# Patient Record
Sex: Male | Born: 1995 | Race: White | Hispanic: No | Marital: Single | Smoking: Current some day smoker
Health system: Southern US, Community
[De-identification: ages and names within clinical notes are randomized; demographics above are authoritative.]

## PROBLEM LIST (undated history)

## (undated) DIAGNOSIS — Z21 Asymptomatic human immunodeficiency virus [HIV] infection status: Secondary | ICD-10-CM

## (undated) DIAGNOSIS — B20 Human immunodeficiency virus [HIV] disease: Secondary | ICD-10-CM

## (undated) HISTORY — DX: Asymptomatic human immunodeficiency virus (hiv) infection status: Z21

## (undated) HISTORY — DX: Human immunodeficiency virus (HIV) disease: B20

---

## 2014-01-15 ENCOUNTER — Encounter (HOSPITAL_COMMUNITY): Payer: Self-pay | Admitting: Emergency Medicine

## 2014-01-15 ENCOUNTER — Emergency Department (HOSPITAL_COMMUNITY)
Admission: EM | Admit: 2014-01-15 | Discharge: 2014-01-15 | Disposition: A | Discharge status: Home / Self Care | Payer: Managed Care, Other (non HMO) | Attending: Emergency Medicine | Admitting: Emergency Medicine

## 2014-01-15 DIAGNOSIS — F17200 Nicotine dependence, unspecified, uncomplicated: Secondary | ICD-10-CM | POA: Diagnosis not present

## 2014-01-15 DIAGNOSIS — R197 Diarrhea, unspecified: Secondary | ICD-10-CM

## 2014-01-15 LAB — CBC WITH DIFFERENTIAL/PLATELET
Basophils Absolute: 0.1 10*3/uL (ref 0.0–0.1)
Basophils Relative: 1 % (ref 0–1)
EOS ABS: 0.2 10*3/uL (ref 0.0–0.7)
Eosinophils Relative: 2 % (ref 0–5)
HCT: 48.9 % (ref 39.0–52.0)
HEMOGLOBIN: 16.4 g/dL (ref 13.0–17.0)
LYMPHS ABS: 3 10*3/uL (ref 0.7–4.0)
Lymphocytes Relative: 24 % (ref 12–46)
MCH: 28.5 pg (ref 26.0–34.0)
MCHC: 33.5 g/dL (ref 30.0–36.0)
MCV: 84.9 fL (ref 78.0–100.0)
Monocytes Absolute: 1 10*3/uL (ref 0.1–1.0)
Monocytes Relative: 8 % (ref 3–12)
NEUTROS ABS: 7.9 10*3/uL — AB (ref 1.7–7.7)
NEUTROS PCT: 65 % (ref 43–77)
Platelets: 314 10*3/uL (ref 150–400)
RBC: 5.76 MIL/uL (ref 4.22–5.81)
RDW: 13.6 % (ref 11.5–15.5)
WBC: 12.2 10*3/uL — AB (ref 4.0–10.5)

## 2014-01-15 LAB — COMPREHENSIVE METABOLIC PANEL
ALK PHOS: 94 U/L (ref 39–117)
ALT: 15 U/L (ref 0–53)
ANION GAP: 12 (ref 5–15)
AST: 20 U/L (ref 0–37)
Albumin: 4.5 g/dL (ref 3.5–5.2)
BILIRUBIN TOTAL: 0.9 mg/dL (ref 0.3–1.2)
BUN: 17 mg/dL (ref 6–23)
CHLORIDE: 110 meq/L (ref 96–112)
CO2: 21 mEq/L (ref 19–32)
Calcium: 9.5 mg/dL (ref 8.4–10.5)
Creatinine, Ser: 1.28 mg/dL (ref 0.50–1.35)
GFR calc non Af Amer: 80 mL/min — ABNORMAL LOW (ref >90)
GLUCOSE: 87 mg/dL (ref 70–99)
POTASSIUM: 3.9 meq/L (ref 3.7–5.3)
Sodium: 143 mEq/L (ref 137–147)
Total Protein: 7.7 g/dL (ref 6.0–8.3)

## 2014-01-15 LAB — LIPASE, BLOOD: Lipase: 19 U/L (ref 11–59)

## 2014-01-15 MED ORDER — DIPHENOXYLATE-ATROPINE 2.5-0.025 MG PO TABS: 2.0000 | ORAL_TABLET | Freq: Four times a day (QID) | ORAL | Status: DC | PRN

## 2014-01-15 NOTE — ED Notes (Signed)
Patient reports abdominal cramping onset yesterday with "black" diarrhea, reports 4 episodes of same diarrhea today, denies vomiting, pain 1/10, non-radiating, denies fever/chills, reports dizziness earlier today, denies at this time, reports decreased urine volume. Patient reports urgency to urinate but hesitation for same.

## 2014-01-15 NOTE — Progress Notes (Signed)
  CARE MANAGEMENT ED NOTE 01/15/2014  Patient:  Ronnie Patterson, Ronnie Patterson   Account Number:  [id number]  Date Initiated:  01/15/2014  Documentation initiated by:  Edd Arbour  Subjective/Objective Assessment:   18 yr old [employer] managed diarrhea x2 days. Pt. States he had diarrhea all day yesterday, this AM his stool was loose and black in color. Pt. Denies N/V.     Subjective/Objective Assessment Detail:   Pt confirms he is in "school here" (in [place]) but from [place]  Pt informed CM " the doctor states I will be fine" (referring to EDP, Freida Busman) when Cm asked him if he had a pc to f/u with     Action/Plan:   ED CM spoke with pt and male at bedside Discussed importance of f/u with a family doctor even in [place] via [place] coverage see note below   Action/Plan Detail:   Anticipated DC Date:  01/15/2014     Status Recommendation to Physician:   Result of Recommendation:    Other ED Services  Consult Working Plan    DC Planning Services  Other  PCP issues  Outpatient Services - Pt will follow up    Choice offered to / List presented to:            Status of service:  Completed, signed off  ED Comments:   ED Comments Detail:  [facility] ED CM spoke with pt on how to obtain an in network pcp with insurance coverage via the customer service number or web site Cm reviewed ED level of care for crisis/emergent services and community pcp level of care to manage continuous or chronic medical concerns.  The pt voiced understanding CM encouraged pt and discussed pt's responsibility to verify with pt's insurance carrier that any recommended medical provider offered by any emergency room or a hospital provider is within the carrier's network. The pt voiced understanding

## 2014-01-15 NOTE — ED Notes (Signed)
Pt is aware that we need a urine sample 

## 2014-01-15 NOTE — ED Notes (Signed)
Pt reports to ED with diarrhea x2 days. Pt. States he had diarrhea all day yesterday, this AM his stool was loose and black in color. Pt. Denies N/V.

## 2014-01-15 NOTE — Discharge Instructions (Signed)

## 2014-01-15 NOTE — ED Notes (Signed)
Key pad not working. Patient verbalizes understanding of medication and d/c instructions.

## 2014-01-15 NOTE — ED Notes (Signed)
Patient tried to urinate, has urinal in room

## 2014-01-15 NOTE — ED Provider Notes (Signed)
CSN: [id number]     Arrival date & time 01/15/14  1200 History   First MD Initiated Contact with Patient 01/15/14 1216     Chief Complaint  Patient presents with  . Diarrhea     (Consider location/radiation/quality/duration/timing/severity/associated sxs/prior Treatment) HPI Comments: Patient here with diarrhea x2 days. Symptoms originally started 5 days ago subsided spontaneously became again today to go. No fever or chills. Diarrhea characterized as watery. No nausea vomiting appreciated. Stool is slightly black in color but he denies any use of Pepto-Bismol. Denies any urinary symptoms. No prior history of same.  Patient is a 18 y.o. male presenting with diarrhea. The history is provided by the patient.  Diarrhea   History reviewed. No pertinent past medical history. History reviewed. No pertinent past surgical history. No family history on file. History  Substance Use Topics  . Smoking status: Current Every Day Smoker -- 0.50 packs/day    Types: Cigarettes  . Smokeless tobacco: Not on file  . Alcohol Use: Yes     Comment: occassionally     Review of Systems  Gastrointestinal: Positive for diarrhea.  All other systems reviewed and are negative.     Allergies  Review of patient's allergies indicates not on file.  Home Medications   Prior to Admission medications   Not on File   BP 155/85  Pulse 96  Temp(Src) 97.7 F (36.5 C) (Oral)  Resp 16  Ht  (1.778 m)  Wt 146 lb 8 oz (66.452 kg)  BMI 21.02 kg/m2  SpO2 100% Physical Exam  Nursing note and vitals reviewed. Constitutional: He is oriented to person, place, and time. He appears well-developed and well-nourished.  Non-toxic appearance. No distress.  HENT:  Head: Normocephalic and atraumatic.  Eyes: Conjunctivae, EOM and lids are normal. Pupils are equal, round, and reactive to light.  Neck: Normal range of motion. Neck supple. No tracheal deviation present. No mass present.  Cardiovascular: Normal  rate, regular rhythm and normal heart sounds.  Exam reveals no gallop.   No murmur heard. Pulmonary/Chest: Effort normal and breath sounds normal. No stridor. No respiratory distress. He has no decreased breath sounds. He has no wheezes. He has no rhonchi. He has no rales.  Abdominal: Soft. Normal appearance and bowel sounds are normal. He exhibits no distension. There is no tenderness. There is no rigidity, no rebound, no guarding and no CVA tenderness.  Musculoskeletal: Normal range of motion. He exhibits no edema and no tenderness.  Neurological: He is alert and oriented to person, place, and time. He has normal strength. No cranial nerve deficit or sensory deficit. GCS eye subscore is 4. GCS verbal subscore is 5. GCS motor subscore is 6.  Skin: Skin is warm and dry. No abrasion and no rash noted.  Psychiatric: He has a normal mood and affect. His speech is normal and behavior is normal.    ED Course  Procedures (including critical care time) Labs Review Labs Reviewed  CBC WITH DIFFERENTIAL - Abnormal; Notable for the following:    WBC 12.2 (*)    Neutro Abs 7.9 (*)    All other components within normal limits  COMPREHENSIVE METABOLIC PANEL  LIPASE, BLOOD  URINALYSIS, ROUTINE W REFLEX MICROSCOPIC    Imaging Review No results found.   EKG Interpretation None      MDM   Final diagnoses:  None    Patient with likely gastroenteritis and stable for discharge    Toy Baker, MD 01/15/14 1449

## 2017-09-20 LAB — POCT RAPID HIV: Rapid HIV, POC: POSITIVE

## 2017-09-27 ENCOUNTER — Telehealth: Payer: Self-pay | Admitting: *Deleted

## 2017-09-27 DIAGNOSIS — Z113 Encounter for screening for infections with a predominantly sexual mode of transmission: Secondary | ICD-10-CM

## 2017-09-27 DIAGNOSIS — Z79899 Other long term (current) drug therapy: Secondary | ICD-10-CM

## 2017-09-27 DIAGNOSIS — B20 Human immunodeficiency virus [HIV] disease: Secondary | ICD-10-CM

## 2017-09-27 NOTE — Telephone Encounter (Signed)
Patient walked in to ask about his HIV referral, but had to leave to take a phone call. RN received referral from [facility] 5/28.  Appointments tentatively scheduled. Will confirm with [facility] tomorrow. Lab orders placed. [employer] form initiated. Andree Coss, RN

## 2017-09-28 ENCOUNTER — Telehealth: Payer: Self-pay | Admitting: *Deleted

## 2017-09-28 ENCOUNTER — Encounter: Payer: Managed Care, Other (non HMO) | Admitting: Licensed Clinical Social Worker

## 2017-09-28 ENCOUNTER — Other Ambulatory Visit (INDEPENDENT_AMBULATORY_CARE_PROVIDER_SITE_OTHER): Payer: Managed Care, Other (non HMO)

## 2017-09-28 ENCOUNTER — Ambulatory Visit: Payer: Managed Care, Other (non HMO)

## 2017-09-28 ENCOUNTER — Other Ambulatory Visit (HOSPITAL_COMMUNITY)
Admission: RE | Admit: 2017-09-28 | Discharge: 2017-09-28 | Disposition: A | Discharge status: Home / Self Care | Payer: Managed Care, Other (non HMO) | Source: Ambulatory Visit | Attending: Family | Admitting: Family

## 2017-09-28 DIAGNOSIS — B20 Human immunodeficiency virus [HIV] disease: Secondary | ICD-10-CM | POA: Diagnosis not present

## 2017-09-28 DIAGNOSIS — Z113 Encounter for screening for infections with a predominantly sexual mode of transmission: Secondary | ICD-10-CM | POA: Diagnosis present

## 2017-09-28 DIAGNOSIS — F4329 Adjustment disorder with other symptoms: Secondary | ICD-10-CM | POA: Diagnosis not present

## 2017-09-28 DIAGNOSIS — Z79899 Other long term (current) drug therapy: Secondary | ICD-10-CM

## 2017-09-28 NOTE — Telephone Encounter (Signed)
Spoke with Ivin Booty. He will be able to come today for bloodwork, to meet with RN. He confirmed insurance coverage - [employer].

## 2017-09-28 NOTE — BH Specialist Note (Signed)
Integrated Behavioral Health Initial Visit  MRN: [id number] Name: Ronnie Patterson  Number of Integrated Behavioral Health Clinician visits:: 1/6 Session Start time: 11:58pm  Session End time: 12:15pm Total time: 15 minutes  Type of Service: Integrated Behavioral Health- Individual/Family Interpretor:No. Interpretor Name and Language: N/a   Warm Hand Off Completed.       SUBJECTIVE: Ronnie Patterson is a 22 y.o. male accompanied by Self Patient was referred by Marcos Eke for adjustment to diagnosis. Patient reports the following symptoms/concerns: recently diagnosis, in shock, not sure what to do next, trouble concentrating, worried for the future Duration of problem: 1 week; Severity of problem: moderate  OBJECTIVE: Mood: Anxious and Affect: Blunt Risk of harm to self or others: No plan to harm self or others  LIFE CONTEXT: Patient has just learned of his HIV+ status and is not sure what he thinks or feels at present. He states that he is in shock and needs to let the news set in. Patient reports a history of depression and worries a little that this may return. He took Welbutrin for 6 months and is hoping to restart it soon.  GOALS ADDRESSED: Patient will: 1. Reduce symptoms of: anxiety  INTERVENTIONS: Interventions utilized: Motivational Interviewing and Brief CBT   ASSESSMENT: Patient currently experiencing worry and shock. He is struggling to adjust to the news that he is HIV+. 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 Counselor will continue to assess for diagnosis of depression, as reported, though today he does not endorse symptoms of depression (this may be masked by shock/adjustment concerns). Counselor educated patient on self-care and commended him for asking about restarting meds. Counselor and patient explored patient's goals and what he wants to see for himself. At the moment he wants to adjust to living with HIV and  will reassess once he feels he is better able to accept the diagnosis.    Patient may benefit from regular counseling to address adjustment concerns.   PLAN: 1. Follow up with behavioral health clinician on : 10/12/17 2. Behavioral recommendations: engage in self-care and utilize supports given to learn about diagnosis.  Angus Palms, LCSW

## 2017-09-28 NOTE — Telephone Encounter (Signed)
RN met with Ronnie Patterson at his intake lab visit. He states he is doing "ok" with his diagnosis, has had 1 week to process and feels ready to be in control. He has disclosed to several friends, managers at work whom he feel are supportive. He does have concerns about disclosing to his mother. RN offered support, stated he was welcome to bring anyone to his visits with him. RN advised we can offer partner testing, PrEP, and counseling services. He is familiar with [facility], he will meet at appointment 6/12. RN introduced him to [place] and [place]. Confirmed upcoming appointments. Landis Gandy, RN

## 2017-09-29 LAB — URINALYSIS
Bilirubin Urine: NEGATIVE
Glucose, UA: NEGATIVE
Hgb urine dipstick: NEGATIVE
Ketones, ur: NEGATIVE
Leukocytes, UA: NEGATIVE
Nitrite: NEGATIVE
PROTEIN: NEGATIVE
Specific Gravity, Urine: 1.021 (ref 1.001–1.03)

## 2017-09-29 LAB — T-HELPER CELL (CD4) - (RCID CLINIC ONLY)
CD4 T CELL ABS: 1180 /uL (ref 400–2700)
CD4 T CELL HELPER: 40 % (ref 33–55)

## 2017-09-29 LAB — QUANTIFERON-TB GOLD PLUS
NIL: 0.12 IU/mL
QuantiFERON-TB Gold Plus: NEGATIVE
TB1-NIL: 0.01 IU/mL
TB2-NIL: 0.1 IU/mL

## 2017-09-29 LAB — URINE CYTOLOGY ANCILLARY ONLY
Chlamydia: NEGATIVE
Neisseria Gonorrhea: NEGATIVE

## 2017-10-04 LAB — COMPLETE METABOLIC PANEL WITH GFR
AG Ratio: 1.8 (calc) (ref 1.0–2.5)
ALT: 19 U/L (ref 9–46)
AST: 22 U/L (ref 10–40)
Albumin: 4.6 g/dL (ref 3.6–5.1)
Alkaline phosphatase (APISO): 72 U/L (ref 40–115)
BUN: 16 mg/dL (ref 7–25)
CALCIUM: 9.5 mg/dL (ref 8.6–10.3)
CO2: 28 mmol/L (ref 20–32)
Chloride: 107 mmol/L (ref 98–110)
Creat: 1.2 mg/dL (ref 0.60–1.35)
GFR, EST NON AFRICAN AMERICAN: 85 mL/min/{1.73_m2} (ref >=60)
GFR, Est African American: 99 mL/min/{1.73_m2} (ref >=60)
Globulin: 2.5 g/dL (calc) (ref 1.9–3.7)
Glucose, Bld: 76 mg/dL (ref 65–99)
Potassium: 4.2 mmol/L (ref 3.5–5.3)
SODIUM: 143 mmol/L (ref 135–146)
Total Bilirubin: 0.9 mg/dL (ref 0.2–1.2)
Total Protein: 7.1 g/dL (ref 6.1–8.1)

## 2017-10-04 LAB — CBC WITH DIFFERENTIAL/PLATELET
BASOS ABS: 73 {cells}/uL (ref 0–200)
BASOS PCT: 1.2 %
EOS ABS: 342 {cells}/uL (ref 15–500)
Eosinophils Relative: 5.6 %
HCT: 45 % (ref 38.5–50.0)
Hemoglobin: 15.6 g/dL (ref 13.2–17.1)
Lymphs Abs: 2794 cells/uL (ref 850–3900)
MCH: 28.6 pg (ref 27.0–33.0)
MCHC: 34.7 g/dL (ref 32.0–36.0)
MCV: 82.4 fL (ref 80.0–100.0)
MONOS PCT: 12.5 %
MPV: 9.5 fL (ref 7.5–12.5)
NEUTROS ABS: 2129 {cells}/uL (ref 1500–7800)
Neutrophils Relative %: 34.9 %
Platelets: 279 10*3/uL (ref 140–400)
RBC: 5.46 10*6/uL (ref 4.20–5.80)
RDW: 13.4 % (ref 11.0–15.0)
Total Lymphocyte: 45.8 %
WBC mixed population: 763 cells/uL (ref 200–950)
WBC: 6.1 10*3/uL (ref 3.8–10.8)

## 2017-10-04 LAB — HEPATITIS B SURFACE ANTIBODY,QUALITATIVE: HEP B S AB: NONREACTIVE

## 2017-10-04 LAB — RPR: RPR Ser Ql: REACTIVE — AB

## 2017-10-04 LAB — LIPID PANEL
CHOLESTEROL: 126 mg/dL (ref <200)
HDL: 52 mg/dL (ref >40)
LDL Cholesterol (Calc): 58 mg/dL (calc)
Non-HDL Cholesterol (Calc): 74 mg/dL (calc) (ref <130)
Total CHOL/HDL Ratio: 2.4 (calc) (ref <5.0)
Triglycerides: 80 mg/dL (ref <150)

## 2017-10-04 LAB — HIV-1/2 AB - DIFFERENTIATION
HIV 1 ANTIBODY: POSITIVE — AB
HIV 2 AB: NEGATIVE

## 2017-10-04 LAB — FLUORESCENT TREPONEMAL AB(FTA)-IGG-BLD: Fluorescent Treponemal ABS: REACTIVE — AB

## 2017-10-04 LAB — HLA B*5701: HLA-B*5701 w/rflx HLA-B High: NEGATIVE

## 2017-10-04 LAB — HEPATITIS C ANTIBODY
HEP C AB: NONREACTIVE
SIGNAL TO CUT-OFF: 0.04 (ref <1.00)

## 2017-10-04 LAB — HEPATITIS B SURFACE ANTIGEN: Hepatitis B Surface Ag: NONREACTIVE

## 2017-10-04 LAB — HIV ANTIBODY (ROUTINE TESTING W REFLEX): HIV: REACTIVE — AB

## 2017-10-04 LAB — RPR TITER: RPR Titer: 1:32 {titer} — ABNORMAL HIGH

## 2017-10-04 LAB — HEPATITIS A ANTIBODY, TOTAL: Hepatitis A AB,Total: NONREACTIVE

## 2017-10-04 LAB — HEPATITIS B CORE ANTIBODY, TOTAL: HEP B C TOTAL AB: NONREACTIVE

## 2017-10-12 ENCOUNTER — Ambulatory Visit: Payer: Managed Care, Other (non HMO) | Admitting: Licensed Clinical Social Worker

## 2017-10-12 ENCOUNTER — Encounter: Payer: Self-pay | Admitting: Family

## 2017-10-12 ENCOUNTER — Ambulatory Visit (INDEPENDENT_AMBULATORY_CARE_PROVIDER_SITE_OTHER): Payer: Managed Care, Other (non HMO) | Admitting: Family

## 2017-10-12 ENCOUNTER — Ambulatory Visit (INDEPENDENT_AMBULATORY_CARE_PROVIDER_SITE_OTHER): Payer: Managed Care, Other (non HMO) | Admitting: Pharmacist

## 2017-10-12 VITALS — BP 109/69 | HR 73 | Temp 98.2°F | Ht 69.0 in | Wt 148.0 lb

## 2017-10-12 DIAGNOSIS — A530 Latent syphilis, unspecified as early or late: Secondary | ICD-10-CM

## 2017-10-12 DIAGNOSIS — R55 Syncope and collapse: Secondary | ICD-10-CM | POA: Diagnosis not present

## 2017-10-12 DIAGNOSIS — B20 Human immunodeficiency virus [HIV] disease: Secondary | ICD-10-CM

## 2017-10-12 LAB — HIV-1 RNA ULTRAQUANT REFLEX TO GENTYP+
HIV 1 RNA QUANT: 1330 {copies}/mL — AB
HIV-1 RNA Quant, Log: 3.12 Log cps/mL — ABNORMAL HIGH

## 2017-10-12 LAB — RFLX HIV-1 INTEGRASE GENOTYPE: HIV-1 Genotype: DETECTED — AB

## 2017-10-12 MED ORDER — PENICILLIN G BENZATHINE 1200000 UNIT/2ML IM SUSP
2.4000 10*6.[IU] | Freq: Once | INTRAMUSCULAR | Status: DC
  Administered 2017-10-12: 2.4 10*6.[IU] via INTRAMUSCULAR

## 2017-10-12 MED ORDER — BICTEGRAVIR-EMTRICITAB-TENOFOV 50-200-25 MG PO TABS: 1.0000 | ORAL_TABLET | Freq: Every day | ORAL | 2 refills | Status: DC

## 2017-10-12 MED ORDER — PENICILLIN G BENZATHINE 1200000 UNIT/2ML IM SUSP
2.4000 10*6.[IU] | INTRAMUSCULAR | Status: DC
  Administered 2017-10-12: 2.4 10*6.[IU] via INTRAMUSCULAR

## 2017-10-12 MED FILL — BIKTARVY 50-200-25 MG TABS: 50-200-25 | 30 days supply | Qty: 30 | Fill #0

## 2017-10-12 NOTE — Progress Notes (Signed)
HPI: Ronnie Patterson is a 22 y.o. male who presents to the [facility] clinic today to establish care for his newly diagnosed HIV infection.  Patient Active Problem List   Diagnosis Date Noted  . HIV disease (HCC) 10/12/2017  . 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 Vasovagal syncope 10/12/2017    Patient's Medications  New Prescriptions   BICTEGRAVIR-EMTRICITABINE-TENOFOVIR AF (BIKTARVY) 50-200-25 MG TABS TABLET    Take 1 tablet by mouth daily.  Previous Medications   BISMUTH SUBSALICYLATE (PEPTO BISMOL) 262 MG/15ML SUSPENSION    Take 30 mLs by mouth every 6 (six) hours as needed for indigestion.   IBUPROFEN (ADVIL,MOTRIN) 200 MG TABLET    Take 400 mg by mouth every 6 (six) hours as needed for moderate pain.  Modified Medications   No medications on file  Discontinued Medications   No medications on file    Allergies: No Known Allergies  Past Medical History: Past Medical History:  Diagnosis Date  . HIV infection [facility](HCC)     Social History: Social History   Socioeconomic History  . Marital status: Single    Spouse name: Not on file  . Number of children: Not on file  . Years of education: Not on file  . Highest education level: Not on file  Occupational History  . Not on file  Social Needs  . Financial resource strain: Not on file  . Food insecurity:    Worry: Not on file    Inability: Not on file  . Transportation needs:    Medical: Not on file    Non-medical: Not on file  Tobacco Use  . Smoking status: Current Every Day Smoker    Packs/day: 0.50    Years: 4.00    Pack years: 2.00    Types: Cigarettes, E-cigarettes  . Smokeless tobacco: Never Used  Substance and Sexual Activity  . Alcohol use: Yes    Comment: occassionally   . Drug use: Yes    Frequency: 7.0 times per week    Types: Marijuana  . Sexual activity: Not on file  Lifestyle  . Physical activity:    Days per week: Not on file    Minutes per session: Not on file  . Stress: Not on file  Relationships  .  Social connections:    Talks on phone: Not on file    Gets together: Not on file    Attends religious service: Not on file    Active member of club or organization: Not on file    Attends meetings of clubs or organizations: Not on file    Relationship status: Not on file  Other Topics Concern  . Not on file  Social History Narrative  . Not on file    Labs: Lab Results  Component Value Date   HIV1RNAQUANT 1,330 (H) 09/28/2017   CD4TABS 1,180 09/28/2017    RPR and STI Lab Results  Component Value Date   LABRPR REACTIVE (A) 09/28/2017   RPRTITER 1:32 (H) 09/28/2017    STI Results GC CT  09/28/2017 Negative Negative    Hepatitis B Lab Results  Component Value Date   HEPBSAB NON-REACTIVE 09/28/2017   HEPBSAG NON-REACTIVE 09/28/2017   HEPBCAB NON-REACTIVE 09/28/2017   Hepatitis C Lab Results  Component Value Date   HEPCAB NON-REACTIVE 09/28/2017   Hepatitis A Lab Results  Component Value Date   HAV NON-REACTIVE 09/28/2017   Lipids: Lab Results  Component Value Date   CHOL 126 09/28/2017   TRIG 80 09/28/2017   HDL  52 09/28/2017   CHOLHDL 2.4 09/28/2017   LDLCALC 58 09/28/2017    Current HIV Regimen: None  Assessment: Ronnie Patterson is here today to initiate care with Ronnie Patterson for his newly diagnosed HIV infection.  He is treatment naive with an initial HIV viral load of 1,330 and a CD4 count of 1,180. His resistance labs are still pending.  Will start Biktarvy for him today.  Counseled him on how to take the medication including one pill once daily with or without food. Encouraged him to take it around the same time each day and to not miss any doses in order to prevent resistance from developing. He does have [employer] and will pick it up at [facility]. Kathie Rhodes activated a co-pay card for him and will apply it to his account. I gave him my card and told him to call me with any issues/questions/concerns.  Plan: - Start Biktarvy PO once daily - Pick up at [facility]  Cassie L.  Kuppelweiser, PharmD, AAHIVP, CPP Infectious Diseases Clinical Pharmacist [facility] 10/12/2017, 4:33 PM

## 2017-10-12 NOTE — Assessment & Plan Note (Signed)
Following receiving injection patient was noted to have have vasovagal syncope with seizure like activity. He was laid in a supine position from a seated position in the chair and resumed consciousness almost instantaneously. Vital signs were monitored and stable throughout. He was given a small snack and drink. Vitals were monitored for 30 minutes and he was able to leave under his own power and had a ride home. Continue to monitor during future injections.

## 2017-10-12 NOTE — Progress Notes (Signed)
 Subjective:    Patient ID: Ronnie Patterson, male    DOB: 04/14/1996, 21 y.o.   MRN: [id number]  Chief Complaint  Patient presents with  . HIV Positive/AIDS    HPI:  Ronnie Patterson is a 21 y.o. male who presents today for initial evaluation and treatment of HIV disease.   Ronnie Patterson was recently diagnosed with HIV-1 during routine screening blood work. His risk factors for HIV include MSM. He has disclosed his positive status to several friends and managers at work whom he feels are supportive of the diagnosis. He has not received treatment for HIV to this point. Most recent blood work reviewed from 09/28/17 confirming HIV-1 positive status with a viral load of 1,330 and a CD4 count of 1180. He was negative for HLA-B 5701, gonorrhea, chlamydia, Hepatitis B and Hepatitis C. He is not immune to Hepatitis B. His RPR was positive with a titer of 1:32. He has not received treatment for syphilis.    No Known Allergies    Outpatient Medications Prior to Visit  Medication Sig Dispense Refill  . bismuth subsalicylate (PEPTO BISMOL) 262 MG/15ML suspension Take 30 mLs by mouth every 6 (six) hours as needed for indigestion.    . ibuprofen (ADVIL,MOTRIN) 200 MG tablet Take 400 mg by mouth every 6 (six) hours as needed for moderate pain.    . diphenoxylate-atropine (LOMOTIL) 2.5-0.025 MG per tablet Take 2 tablets by mouth 4 (four) times daily as needed for diarrhea or loose stools. 30 tablet 0   No facility-administered medications prior to visit.      Past Medical History:  Diagnosis Date  . HIV infection (HCC)       No past surgical history on file.    No family history on file.    Social History   Socioeconomic History  . Marital status: Single    Spouse name: Not on file  . Number of children: Not on file  . Years of education: Not on file  . Highest education level: Not on file  Occupational History  . Not on file  Social Needs  . Financial resource strain: Not on file  .  Food insecurity:    Worry: Not on file    Inability: Not on file  . Transportation needs:    Medical: Not on file    Non-medical: Not on file  Tobacco Use  . Smoking status: Current Every Day Smoker    Packs/day: 0.50    Years: 4.00    Pack years: 2.00    Types: Cigarettes, E-cigarettes  . Smokeless tobacco: Never Used  Substance and Sexual Activity  . Alcohol use: Yes    Comment: occassionally   . Drug use: Yes    Frequency: 7.0 times per week    Types: Marijuana  . Sexual activity: Not on file  Lifestyle  . Physical activity:    Days per week: Not on file    Minutes per session: Not on file  . Stress: Not on file  Relationships  . Social connections:    Talks on phone: Not on file    Gets together: Not on file    Attends religious service: Not on file    Active member of club or organization: Not on file    Attends meetings of clubs or organizations: Not on file    Relationship status: Not on file  . Intimate partner violence:    Fear of current or ex partner: Not on file    Emotionally   abused: Not on file    Physically abused: Not on file    Forced sexual activity: Not on file  Other Topics Concern  . Not on file  Social History Narrative  . Not on file      Review of Systems  Constitutional: Negative for activity change, appetite change, diaphoresis, fatigue, fever and unexpected weight change.  HENT: Negative for congestion, sinus pressure and sore throat.   Respiratory: Negative for cough, chest tightness, shortness of breath and wheezing.   Cardiovascular: Negative for chest pain and leg swelling.  Gastrointestinal: Negative for abdominal pain, constipation, diarrhea, nausea and vomiting.  Genitourinary: Negative for dysuria, flank pain, frequency, genital sores, hematuria and urgency.  Neurological: Negative for weakness and headaches.       Objective:    BP 109/69   Pulse 73   Temp 98.2 F (36.8 C) (Oral)   Ht 5' 9" (1.753 m)   Wt 148 lb (67.1  kg)   BMI 21.86 kg/m  Nursing note and vital signs reviewed.  Physical Exam  Constitutional: He is oriented to person, place, and time. He appears well-developed. No distress.  HENT:  Mouth/Throat: Oropharynx is clear and moist.  Eyes: Conjunctivae are normal.  Neck: Neck supple.  Cardiovascular: Normal rate, regular rhythm, normal heart sounds and intact distal pulses. Exam reveals no gallop and no friction rub.  No murmur heard. Pulmonary/Chest: Effort normal and breath sounds normal. No respiratory distress. He has no wheezes. He has no rales. He exhibits no tenderness.  Abdominal: Soft. Bowel sounds are normal. There is no tenderness.  Lymphadenopathy:    He has no cervical adenopathy.  Neurological: He is alert and oriented to person, place, and time.  Skin: Skin is warm and dry. No rash noted.  Psychiatric: He has a normal mood and affect. His behavior is normal. Judgment and thought content normal.        Assessment & Plan:   Problem List Items Addressed This Visit      Cardiovascular and Mediastinum   Vasovagal syncope    Following receiving injection patient was noted to have have vasovagal syncope with seizure like activity. He was laid in a supine position from a seated position in the chair and resumed consciousness almost instantaneously. Vital signs were monitored and stable throughout. He was given a small snack and drink. Vitals were monitored for 30 minutes and he was able to leave under his own power and had a ride home. Continue to monitor during future injections.         Other   HIV disease (HCC) - Primary    Ronnie Patterson is newly diagnosed with HIV during routine screening with the risk factor of MSM. His initial CD4 count is 1330 and viral load is 1,1180. His HLA-B 5701 was negative. Positive for RPR with 1:32.  Counseled on transmission, treatments, risk of opportunistic infection, and using protection. Plan to start Biktarvy with follow up CD4 count and  viral load in 1 month. He was counseled by the pharmacy regarding medication.       Latent syphilis    Previously treated for syphilis about 2 years ago and continues to have a positive titer of 1:32. He does not have any current symptoms. 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 Recommend treatment of 3 weekly injections of 2.4 million units of Bicillin with follow up in 6 months.       Relevant Medications   penicillin g benzathine (BICILLIN   LA) 1200000 UNIT/2ML injection 2.4 Million Units       I have discontinued Torence Farabee's diphenoxylate-atropine. I am also having him maintain his bismuth subsalicylate and ibuprofen. We administered penicillin g benzathine and penicillin g benzathine. We will continue to administer penicillin g benzathine.   Meds ordered this encounter  Medications  . DISCONTD: penicillin g benzathine (BICILLIN LA) 1200000 UNIT/2ML injection 2.4 Million Units  . penicillin g benzathine (BICILLIN LA) 1200000 UNIT/2ML injection 2.4 Million Units     Follow-up: Return in about 1 week (around 10/19/2017).   Mauricio Po, [facility]

## 2017-10-12 NOTE — Patient Instructions (Addendum)
Nice to meet you.  We will get you started on Biktarvy.   Plan to follow up in 1 week for the next Bicillin injection for the next 2 weeks.   We will follow up in 1 month to recheck your viral load and CD4 count.   Please let us know if you have any questions.

## 2017-10-12 NOTE — Assessment & Plan Note (Signed)
Ronnie Patterson is newly diagnosed with HIV during routine screening with the risk factor of MSM. His initial CD4 count is 1330 and viral load is 1,1180. His HLA-B 5701 was negative. Positive for RPR with 1:32.  Counseled on transmission, treatments, risk of opportunistic infection, and using protection. Plan to start [place] with follow up CD4 count and viral load in 1 month. He was counseled by the pharmacy regarding medication.

## 2017-10-12 NOTE — Assessment & Plan Note (Signed)
Previously treated for syphilis about 2 years ago and continues to have a positive titer of 1:32. He does not have any current symptoms. 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 Recommend treatment of 3 weekly injections of 2.4 million units of Bicillin with follow up in 6 months.

## 2017-10-19 ENCOUNTER — Ambulatory Visit (INDEPENDENT_AMBULATORY_CARE_PROVIDER_SITE_OTHER): Payer: Managed Care, Other (non HMO) | Admitting: *Deleted

## 2017-10-19 DIAGNOSIS — A539 Syphilis, unspecified: Secondary | ICD-10-CM | POA: Diagnosis not present

## 2017-10-19 MED ORDER — PENICILLIN G BENZATHINE 1200000 UNIT/2ML IM SUSP
1.2000 10*6.[IU] | Freq: Once | INTRAMUSCULAR | Status: AC
  Administered 2017-10-19: 1.2 10*6.[IU] via INTRAMUSCULAR

## 2017-10-26 ENCOUNTER — Ambulatory Visit (INDEPENDENT_AMBULATORY_CARE_PROVIDER_SITE_OTHER): Payer: Managed Care, Other (non HMO) | Admitting: *Deleted

## 2017-10-26 DIAGNOSIS — A539 Syphilis, unspecified: Secondary | ICD-10-CM | POA: Diagnosis not present

## 2017-10-26 MED ORDER — PENICILLIN G BENZATHINE 1200000 UNIT/2ML IM SUSP: 1.2000 10*6.[IU] | Freq: Once | INTRAMUSCULAR | Status: DC

## 2017-10-26 MED ORDER — PENICILLIN G BENZATHINE 1200000 UNIT/2ML IM SUSP
1.2000 10*6.[IU] | Freq: Once | INTRAMUSCULAR | Status: AC
  Administered 2017-10-26: 1.2 10*6.[IU] via INTRAMUSCULAR

## 2017-11-01 ENCOUNTER — Encounter: Payer: Self-pay | Admitting: *Deleted

## 2017-11-07 MED FILL — BIKTARVY 50-200-25 MG TABS: 50-200-25 | 30 days supply | Qty: 30 | Fill #1

## 2017-11-10 ENCOUNTER — Encounter: Payer: Self-pay | Admitting: Family

## 2017-11-10 ENCOUNTER — Ambulatory Visit (INDEPENDENT_AMBULATORY_CARE_PROVIDER_SITE_OTHER): Payer: Managed Care, Other (non HMO) | Admitting: Family

## 2017-11-10 VITALS — BP 128/76 | HR 94 | Temp 98.0°F | Ht 69.0 in | Wt 155.0 lb

## 2017-11-10 DIAGNOSIS — B20 Human immunodeficiency virus [HIV] disease: Secondary | ICD-10-CM | POA: Diagnosis not present

## 2017-11-10 MED ORDER — BICTEGRAVIR-EMTRICITAB-TENOFOV 50-200-25 MG PO TABS: 1.0000 | ORAL_TABLET | Freq: Every day | ORAL | 2 refills | Status: DC

## 2017-11-10 NOTE — Assessment & Plan Note (Signed)
[name] appears to be doing well with his Biktarvy and adherent to the medication regimen. He has no problems obtaining or taking his medication. There is no evidence of opportunistic infection through history or physical exam. He continues to have stable employment and living situation. Will continue current dosage of Biktarvy. Plan for additional immunizations at next office visit. Will check viral load and CD4 count today. Follow up office visit in 3 months or sooner if needed.

## 2017-11-10 NOTE — Progress Notes (Signed)
Subjective:    Patient ID: Ronnie Patterson, male    DOB: May 13, 1995, 22 y.o.   MRN: [id number]  Chief Complaint  Patient presents with  . Follow-up    HPI:  Ronnie Patterson is a 22 y.o. male who presents today for a routine follow up office visit for his HIV.   Ronnie Patterson was last seen in the office on 10/12/17 for newly diagnosed HIV. He had an initial viral load of 1,330 and a CD4 count of 1180. He was started on Biktarvy at that time. He was also treated for Syphilis with a positive titer of 1:32. He has since received his 3 injections of Bicillin.   Ronnie Patterson reports taking his Biktarvy as prescribed and denies adverse side effects. He has had no missed dosages and has no problems obtaining or taking his medication. Denies fevers, chills, night sweats, headaches, changes in vision, neck pain/stiffness, nausea, diarrhea, vomiting, lesions or rashes.  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 He has plans to take a vacation to [place] [facility] in the near future.    No Known Allergies    Outpatient Medications Prior to Visit  Medication Sig Dispense Refill  . bismuth subsalicylate (PEPTO BISMOL) 262 MG/15ML suspension Take 30 mLs by mouth every 6 (six) hours as needed for indigestion.    Marland Kitchen ibuprofen (ADVIL,MOTRIN) 200 MG tablet Take 400 mg by mouth every 6 (six) hours as needed for moderate pain.    . bictegravir-emtricitabine-tenofovir AF (BIKTARVY) 50-200-25 MG TABS tablet Take 1 tablet by mouth daily. 30 tablet 2   No facility-administered medications prior to visit.      Past Medical History:  Diagnosis Date  . HIV infection (HCC)      History reviewed. No pertinent surgical history.   Review of Systems  Constitutional: Negative for activity change, appetite change, diaphoresis, fatigue, fever and unexpected weight change.  HENT: Negative for congestion, sinus pressure and sore throat.   Respiratory: Negative for cough, chest  tightness, shortness of breath and wheezing.   Cardiovascular: Negative for chest pain and leg swelling.  Gastrointestinal: Negative for abdominal pain, constipation, diarrhea, nausea and vomiting.  Genitourinary: Negative for dysuria, flank pain, frequency, genital sores, hematuria and urgency.  Neurological: Negative for weakness and headaches.      Objective:    BP 128/76   Pulse 94   Temp 98 F (36.7 C) (Oral)   Ht 5\' 9"  (1.753 m)   Wt 155 lb (70.3 kg)   BMI 22.89 kg/m  Nursing note and vital signs reviewed.  Physical Exam  Constitutional: He is oriented to person, place, and time. He appears well-developed. No distress.  HENT:  Mouth/Throat: Oropharynx is clear and moist.  Eyes: Conjunctivae are normal.  Neck: Neck supple.  Cardiovascular: Normal rate, regular rhythm, normal heart sounds and intact distal pulses. Exam reveals no gallop and no friction rub.  No murmur heard. Pulmonary/Chest: Effort normal and breath sounds normal. No respiratory distress. He has no wheezes. He has no rales. He exhibits no tenderness.  Abdominal: Soft. Bowel sounds are normal. There is no tenderness.  Lymphadenopathy:    He has no cervical adenopathy.  Neurological: He is alert and oriented to person, place, and time.  Skin: Skin is warm and dry. No rash noted.  Psychiatric: He has a normal mood and affect. His behavior is normal. Judgment and thought content normal.       Assessment & Plan:   Problem  List Items Addressed This Visit      Other   HIV disease [facility](HCC) - Primary    Ronnie Patterson appears to be doing well with his Biktarvy and adherent to the medication regimen. He has no problems obtaining or taking his medication. There is no evidence of opportunistic infection through history or physical exam. He continues to have stable employment and living situation. Will continue current dosage of Biktarvy. Plan for additional immunizations at next office visit. Will check viral load and CD4  count today. Follow up office visit in 3 months or sooner if needed.       Relevant Medications   bictegravir-emtricitabine-tenofovir AF (BIKTARVY) 50-200-25 MG TABS tablet   Other Relevant Orders   HIV 1 RNA quant-no reflex-bld   T-helper cell (CD4)- ([facility] clinic only)     I am having Ronnie Patterson maintain his bismuth subsalicylate, ibuprofen, and bictegravir-emtricitabine-tenofovir AF.   Follow-up: Return in about 3 months (around 02/10/2018).   Marcos EkeGreg Jaymen Fetch, MSN, [facility]NP[facility]

## 2017-11-10 NOTE — Patient Instructions (Signed)
Nice to see you.  We will check your blood work today.  Continue to take your medication as prescribed.  We will see you back in 3 months.

## 2017-11-11 LAB — T-HELPER CELL (CD4) - (RCID CLINIC ONLY)
CD4 % Helper T Cell: 40 % (ref 33–55)
CD4 T Cell Abs: 1200 /uL (ref 400–2700)

## 2017-11-14 ENCOUNTER — Encounter (INDEPENDENT_AMBULATORY_CARE_PROVIDER_SITE_OTHER): Payer: Self-pay

## 2017-11-14 LAB — HIV-1 RNA QUANT-NO REFLEX-BLD
HIV 1 RNA Quant: 45 copies/mL — ABNORMAL HIGH
HIV-1 RNA QUANT, LOG: 1.65 {Log_copies}/mL — AB

## 2017-12-02 MED FILL — BIKTARVY 50-200-25 MG TABS: 50-200-25 | 30 days supply | Qty: 30 | Fill #2

## 2017-12-27 MED FILL — BIKTARVY 50-200-25 MG TABS: 50-200-25 | 30 days supply | Qty: 30 | Fill #0

## 2018-01-26 MED FILL — BIKTARVY 50-200-25 MG TABS: 50-200-25 | 30 days supply | Qty: 30 | Fill #1

## 2018-02-10 ENCOUNTER — Ambulatory Visit (INDEPENDENT_AMBULATORY_CARE_PROVIDER_SITE_OTHER): Payer: Managed Care, Other (non HMO) | Admitting: Family

## 2018-02-10 ENCOUNTER — Encounter: Payer: Self-pay | Admitting: Family

## 2018-02-10 VITALS — BP 126/88 | HR 85 | Temp 98.5°F | Ht 70.0 in | Wt 154.0 lb

## 2018-02-10 DIAGNOSIS — Z23 Encounter for immunization: Secondary | ICD-10-CM

## 2018-02-10 DIAGNOSIS — B20 Human immunodeficiency virus [HIV] disease: Secondary | ICD-10-CM | POA: Diagnosis not present

## 2018-02-10 DIAGNOSIS — A530 Latent syphilis, unspecified as early or late: Secondary | ICD-10-CM

## 2018-02-10 LAB — T-HELPER CELL (CD4) - (RCID CLINIC ONLY)
CD4 T CELL ABS: 1010 /uL (ref 400–2700)
CD4 T CELL HELPER: 42 % (ref 33–55)

## 2018-02-10 MED ORDER — BICTEGRAVIR-EMTRICITAB-TENOFOV 50-200-25 MG PO TABS: 1.0000 | ORAL_TABLET | Freq: Every day | ORAL | 2 refills | Status: DC

## 2018-02-10 NOTE — Progress Notes (Signed)
Subjective:    Patient ID: Ronnie Patterson, male    DOB: Sep 26, 1995, 22 y.o.   MRN: [id number]  Chief Complaint  Patient presents with  . HIV Positive/AIDS    HPI:  Ronnie Patterson is a 22 y.o. male who presents today for routine follow up of HIV disease.   Ronnie Patterson was last seen in the office on 11/10/17 for routine followed up and noted to have a viral load of 45 and CD4 count of 1,330. He was continued on his Biktarvy. He is due for Prevnar and influenza vaccinations today.   Ronnie Patterson has been taking his Biktarvy as prescribed with no adverse side effects or missed doses.  He has no problems obtaining his medication and remains covered commercially through [place].  He receives his medication from [facility]. Denies fevers, chills, night sweats, headaches, changes in vision, neck pain/stiffness, nausea, diarrhea, vomiting, lesions or rashes.  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 He is sporadically sexually active and does use protection.     No Known Allergies    Outpatient Medications Prior to Visit  Medication Sig Dispense Refill  . bictegravir-emtricitabine-tenofovir AF (BIKTARVY) 50-200-25 MG TABS tablet Take 1 tablet by mouth daily. 30 tablet 2  . bismuth subsalicylate (PEPTO BISMOL) 262 MG/15ML suspension Take 30 mLs by mouth every 6 (six) hours as needed for indigestion.    Marland Kitchen ibuprofen (ADVIL,MOTRIN) 200 MG tablet Take 400 mg by mouth every 6 (six) hours as needed for moderate pain.     No facility-administered medications prior to visit.      Past Medical History:  Diagnosis Date  . HIV infection (HCC)      History reviewed. No pertinent surgical history.   Review of Systems  Constitutional: Negative for appetite change, chills, fatigue, fever and unexpected weight change.  Eyes: Negative for visual disturbance.  Respiratory: Negative for cough, chest tightness, shortness of  breath and wheezing.   Cardiovascular: Negative for chest pain and leg swelling.  Gastrointestinal: Negative for abdominal pain, constipation, diarrhea, nausea and vomiting.  Genitourinary: Negative for dysuria, flank pain, frequency, genital sores, hematuria and urgency.  Skin: Negative for rash.  Allergic/Immunologic: Negative for immunocompromised state.  Neurological: Negative for dizziness and headaches.      Objective:    BP 126/88   Pulse 85   Temp 98.5 F (36.9 C)   Ht 5\' 10"  (1.778 m)   Wt 154 lb (69.9 kg)   BMI 22.10 kg/m  Nursing note and vital signs reviewed.  Physical Exam  Constitutional: He is oriented to person, place, and time. He appears well-developed. No distress.  HENT:  Mouth/Throat: Oropharynx is clear and moist.  Eyes: Conjunctivae are normal.  Neck: Neck supple.  Cardiovascular: Normal rate, regular rhythm, normal heart sounds and intact distal pulses. Exam reveals no gallop and no friction rub.  No murmur heard. Pulmonary/Chest: Effort normal and breath sounds normal. No respiratory distress. He has no wheezes. He has no rales. He exhibits no tenderness.  Abdominal: Soft. Bowel sounds are normal. There is no tenderness.  Lymphadenopathy:    He has no cervical adenopathy.  Neurological: He is alert and oriented to person, place, and time.  Skin: Skin is warm and dry. No rash noted.  Psychiatric: He has a normal mood and affect. His behavior is normal. Judgment and thought content normal.        Assessment & Plan:   Problem  List Items Addressed This Visit      Other   HIV disease [facility]) - Primary    Ronnie Patterson is doing very well with his ART regimen of Biktarvy and continues to take it without adverse side effects. He has no problems obtaining his medication and remains commercially insured. He has no symptoms of progressive HIV disease or opportunistic infection. Prevnar and influenza updated today. Will check CD4 count and viral load. Condoms  provided per patient request. Continue current dose of Biktarvy. Plan for office follow up in 3 months or sooner if needed with lab work 1-2 weeks prior to appointment.       Relevant Medications   bictegravir-emtricitabine-tenofovir AF (BIKTARVY) 50-200-25 MG TABS tablet   Other Relevant Orders   HIV 1 RNA quant-no reflex-bld   T-helper cell (CD4)- ([facility] clinic only)   RPR   T-helper cell (CD4)- ([facility] clinic only)   HIV-1 RNA quant-no reflex-bld   RPR   Pneumococcal conjugate vaccine 13-valent IM (Completed)   Latent syphilis    Previously completed 3 injections of Bicillin. No current symptoms. Recheck RPR.       Relevant Medications   bictegravir-emtricitabine-tenofovir AF (BIKTARVY) 50-200-25 MG TABS tablet   Other Relevant Orders   RPR    Other Visit Diagnoses    Need for vaccination with 13-polyvalent pneumococcal conjugate vaccine       Relevant Orders   Pneumococcal conjugate vaccine 13-valent IM (Completed)   Need for immunization against influenza       Relevant Orders   Flu Vaccine QUAD 36+ mos IM (Completed)       I am having Ronnie Patterson maintain his bismuth subsalicylate, ibuprofen, and bictegravir-emtricitabine-tenofovir AF.   Meds ordered this encounter  Medications  . bictegravir-emtricitabine-tenofovir AF (BIKTARVY) 50-200-25 MG TABS tablet    Sig: Take 1 tablet by mouth daily.    Dispense:  30 tablet    Refill:  2    Order Specific Question:   Supervising Provider    Answer:   Judyann Munson [[id number]]     Follow-up: Return in about 3 months (around 05/13/2018), or if symptoms worsen or fail to improve.   Marcos Eke, MSN, FNP-C Nurse Practitioner [facility] for [facility]ious [facility] Office phone: [phone] Pager: [phone] [facility] Main number: [phone]

## 2018-02-10 NOTE — Assessment & Plan Note (Signed)
Previously completed 3 injections of Bicillin. No current symptoms. Recheck RPR.

## 2018-02-10 NOTE — Assessment & Plan Note (Signed)
Ronnie Patterson is doing very well with his ART regimen of Biktarvy and continues to take it without adverse side effects. He has no problems obtaining his medication and remains commercially insured. He has no symptoms of progressive HIV disease or opportunistic infection. Prevnar and influenza updated today. Will check CD4 count and viral load. Condoms provided per patient request. Continue current dose of Biktarvy. Plan for office follow up in 3 months or sooner if needed with lab work 1-2 weeks prior to appointment.

## 2018-02-10 NOTE — Patient Instructions (Signed)
Nice to see you.  Continue to take your [place] as prescribed.  We will check your blood work today.  Follow up in 3 months or sooner if needed with lab work 1-2 weeks prior to your appointment.

## 2018-02-14 LAB — HIV-1 RNA QUANT-NO REFLEX-BLD
HIV 1 RNA Quant: <20 copies/mL
HIV-1 RNA Quant, Log: <1.3 Log copies/mL

## 2018-02-14 LAB — RPR TITER

## 2018-02-14 LAB — RPR: RPR: REACTIVE — AB

## 2018-02-14 LAB — FLUORESCENT TREPONEMAL AB(FTA)-IGG-BLD: Fluorescent Treponemal ABS: REACTIVE — AB

## 2018-02-24 MED FILL — BIKTARVY 50-200-25 MG TABS: 50-200-25 | 30 days supply | Qty: 30 | Fill #2

## 2018-03-20 MED FILL — BIKTARVY 50-200-25 MG TABS: 50-200-25 | 30 days supply | Qty: 30 | Fill #0

## 2018-04-14 MED FILL — BIKTARVY 50-200-25 MG TABS: 50-200-25 | 30 days supply | Qty: 30 | Fill #1

## 2018-05-09 ENCOUNTER — Other Ambulatory Visit: Payer: Managed Care, Other (non HMO)

## 2018-05-09 DIAGNOSIS — B20 Human immunodeficiency virus [HIV] disease: Secondary | ICD-10-CM

## 2018-05-10 LAB — T-HELPER CELL (CD4) - (RCID CLINIC ONLY)
CD4 % Helper T Cell: 45 % (ref 33–55)
CD4 T Cell Abs: 1740 /uL (ref 400–2700)

## 2018-05-11 LAB — RPR: RPR Ser Ql: REACTIVE — AB

## 2018-05-11 LAB — HIV-1 RNA QUANT-NO REFLEX-BLD
HIV 1 RNA Quant: <20 copies/mL
HIV-1 RNA Quant, Log: <1.3 Log copies/mL

## 2018-05-11 LAB — FLUORESCENT TREPONEMAL AB(FTA)-IGG-BLD: Fluorescent Treponemal ABS: REACTIVE — AB

## 2018-05-11 LAB — RPR TITER: RPR Titer: 1:4 {titer} — ABNORMAL HIGH

## 2018-05-16 ENCOUNTER — Other Ambulatory Visit: Payer: Self-pay | Admitting: Pharmacist

## 2018-05-16 DIAGNOSIS — B20 Human immunodeficiency virus [HIV] disease: Secondary | ICD-10-CM

## 2018-05-16 MED ORDER — BICTEGRAVIR-EMTRICITAB-TENOFOV 50-200-25 MG PO TABS: 1.0000 | ORAL_TABLET | Freq: Every day | ORAL | 2 refills | Status: DC

## 2018-05-16 NOTE — Progress Notes (Signed)
Transferring to [employer]

## 2018-05-23 ENCOUNTER — Ambulatory Visit (INDEPENDENT_AMBULATORY_CARE_PROVIDER_SITE_OTHER): Payer: Managed Care, Other (non HMO) | Admitting: Family

## 2018-05-23 ENCOUNTER — Encounter: Payer: Self-pay | Admitting: Family

## 2018-05-23 VITALS — BP 131/81 | HR 98 | Temp 98.2°F | Wt 154.0 lb

## 2018-05-23 DIAGNOSIS — Z0000 Encounter for general adult medical examination without abnormal findings: Secondary | ICD-10-CM | POA: Diagnosis not present

## 2018-05-23 DIAGNOSIS — B20 Human immunodeficiency virus [HIV] disease: Secondary | ICD-10-CM | POA: Diagnosis not present

## 2018-05-23 DIAGNOSIS — Z23 Encounter for immunization: Secondary | ICD-10-CM | POA: Diagnosis not present

## 2018-05-23 MED ORDER — BICTEGRAVIR-EMTRICITAB-TENOFOV 50-200-25 MG PO TABS: 1.0000 | ORAL_TABLET | Freq: Every day | ORAL | 1 refills | Status: DC

## 2018-05-23 NOTE — Progress Notes (Signed)
Subjective:    Patient ID: Ronnie Patterson, male    DOB: April 16, 1996, 23 y.o.   MRN: [id number]  Chief Complaint  Patient presents with  . HIV Positive/AIDS     HPI:  Ronnie Patterson is a 23 y.o. male who presents today for routine follow up of HIV disease.   Ronnie Patterson was last seen in the office on 02/10/2018 for routine follow-up of HIV disease with good adherence and tolerance of his ART regimen of Biktarvy.  Viral load was suppressed and undetectable with a CD4 count of 1010.  Most recent blood work completed on 05/09/2018 with continued viral suppression and remaining undetectable with a CD4 count of 1740.  RPR titer positive 1:4 further confirming successful treatment of syphilis.  Health maintenance due includes dental screening, Pneumovax, and Menveo.  Ronnie Patterson continues to take his Biktarvy as prescribed with no adverse side effects or missed doses.  He recently changed insurance companies and is now covered through [place] acquiring his medications from [facility].  He continues to work full-time in [employer] now at [employer].  He is in stable housing.  Due for a dental visit.  Denies fevers, chills, night sweats, headaches, changes in vision, neck pain/stiffness, nausea, diarrhea, vomiting, lesions or rashes.   No Known Allergies    Outpatient Medications Prior to Visit  Medication Sig Dispense Refill  . ibuprofen (ADVIL,MOTRIN) 200 MG tablet Take 400 mg by mouth every 6 (six) hours as needed for moderate pain.    . bictegravir-emtricitabine-tenofovir AF (BIKTARVY) 50-200-25 MG TABS tablet Take 1 tablet by mouth daily. 30 tablet 2  . bismuth subsalicylate (PEPTO BISMOL) 262 MG/15ML suspension Take 30 mLs by mouth every 6 (six) hours as needed for indigestion.     No facility-administered medications prior to visit.      Past Medical History:  Diagnosis Date  . HIV infection (HCC)     History reviewed. No pertinent surgical  history.   Review of Systems  Constitutional: Negative for appetite change, chills, fatigue, fever and unexpected weight change.  Eyes: Negative for visual disturbance.  Respiratory: Negative for cough, chest tightness, shortness of breath and wheezing.   Cardiovascular: Negative for chest pain and leg swelling.  Gastrointestinal: Negative for abdominal pain, constipation, diarrhea, nausea and vomiting.  Genitourinary: Negative for dysuria, flank pain, frequency, genital sores, hematuria and urgency.  Skin: Negative for rash.  Allergic/Immunologic: Negative for immunocompromised state.  Neurological: Negative for dizziness and headaches.      Objective:    BP 131/81   Pulse 98   Temp 98.2 F (36.8 C) (Oral)   Wt 154 lb (69.9 kg)   BMI 22.10 kg/m  Nursing note and vital signs reviewed.  Physical Exam Constitutional:      General: He is not in acute distress.    Appearance: He is well-developed.  Eyes:     Conjunctiva/sclera: Conjunctivae normal.  Neck:     Musculoskeletal: Neck supple.  Cardiovascular:     Rate and Rhythm: Normal rate and regular rhythm.     Heart sounds: Normal heart sounds. No murmur. No friction rub. No gallop.   Pulmonary:     Effort: Pulmonary effort is normal. No respiratory distress.     Breath sounds: Normal breath sounds. No wheezing or rales.  Chest:     Chest wall: No tenderness.  Abdominal:     General: Bowel sounds are normal.     Palpations: Abdomen is soft.  Tenderness: There is no abdominal tenderness.  Lymphadenopathy:     Cervical: No cervical adenopathy.  Skin:    General: Skin is warm and dry.     Findings: No rash.  Neurological:     Mental Status: He is alert and oriented to person, place, and time.  Psychiatric:        Behavior: Behavior normal.        Thought Content: Thought content normal.        Judgment: Judgment normal.        Assessment & Plan:   Problem List Items Addressed This Visit      Other   HIV  disease (HCC) - Primary    Ronnie Patterson has well-controlled HIV disease with good tolerance and adherence to his ART regimen of Biktarvy.  No signs/symptoms of opportunistic infection or progressive HIV disease.  He does have new insurance, however able to obtain his medication without problem.  Continue current dose of Biktarvy.  Plan for office follow-up in 4 months or sooner if needed with blood work 1 to 2 weeks prior to appointment.      Relevant Medications   bictegravir-emtricitabine-tenofovir AF (BIKTARVY) 50-200-25 MG TABS tablet   Other Relevant Orders   T-helper cell (CD4)- ([facility] clinic only)   HIV-1 RNA quant-no reflex-bld   CBC   Comprehensive metabolic panel   Lipid panel   RPR   Urine cytology ancillary only([place])   Healthcare maintenance     Menveo and Pneumovax updated today.  Referral to [facility] dental clinic for routine dental care placed.  Discussed safe sexual practice and use of protection to prevent acquisition and transmission of STI.          I am having [place] maintain his bismuth subsalicylate, ibuprofen, and bictegravir-emtricitabine-tenofovir AF.   Meds ordered this encounter  Medications  . bictegravir-emtricitabine-tenofovir AF (BIKTARVY) 50-200-25 MG TABS tablet    Sig: Take 1 tablet by mouth daily.    Dispense:  90 tablet    Refill:  1    Order Specific Question:   Supervising Provider    Answer:   Judyann Munson [[id number]]     Follow-up: Return in about 4 months (around 09/21/2018), or if symptoms worsen or fail to improve.   Marcos Eke, MSN, FNP-C Nurse Practitioner [facility] for Infectious Disease [facility] Office phone: [phone] Pager: [phone] [facility] Main number: [phone]

## 2018-05-23 NOTE — Assessment & Plan Note (Signed)
Mr. Sampsell has well-controlled HIV disease with good tolerance and adherence to his ART regimen of Biktarvy.  No signs/symptoms of opportunistic infection or progressive HIV disease.  He does have new insurance, however able to obtain his medication without problem.  Continue current dose of Biktarvy.  Plan for office follow-up in 4 months or sooner if needed with blood work 1 to 2 weeks prior to appointment.

## 2018-05-23 NOTE — Patient Instructions (Signed)
Nice to see you!  Continue to take your [place] daily.  Lab work is looking great.  Plan for office follow up in 4 months or sooner if needed with lab work 1-2 weeks prior to your appointment.

## 2018-05-23 NOTE — Addendum Note (Signed)
Addended by: Lurlean Leyden on: 05/23/2018 03:54 PM   Modules accepted: Orders

## 2018-05-23 NOTE — Assessment & Plan Note (Signed)
   Menveo and Pneumovax updated today.  Referral to [facility] dental clinic for routine dental care placed.  Discussed safe sexual practice and use of protection to prevent acquisition and transmission of STI.

## 2018-06-26 ENCOUNTER — Other Ambulatory Visit: Payer: Self-pay | Admitting: Family

## 2018-06-26 DIAGNOSIS — B20 Human immunodeficiency virus [HIV] disease: Secondary | ICD-10-CM

## 2018-08-03 ENCOUNTER — Encounter: Payer: Self-pay | Admitting: *Deleted

## 2018-09-20 ENCOUNTER — Telehealth: Payer: Self-pay | Admitting: Family

## 2018-09-20 NOTE — Telephone Encounter (Signed)
COVID-19 Pre-Screening Questions:  Do you currently have a fever (>100 F), chills or unexplained body aches? NO   Are you currently experiencing new cough, shortness of breath, sore throat, runny nose?NO .  Have you recently travelled outside the state of [place] in the last 14 days? NO .  Have you been in contact with someone that is currently pending confirmation of Covid19 testing or has been confirmed to have the Covid19 virus?  NO  **If the patient answers NO to ALL questions -  advise the patient to please call the clinic before coming to the office should any symptoms develop.     

## 2018-09-21 ENCOUNTER — Other Ambulatory Visit: Payer: Managed Care, Other (non HMO)

## 2018-09-21 ENCOUNTER — Other Ambulatory Visit: Payer: Self-pay

## 2018-09-21 ENCOUNTER — Other Ambulatory Visit (HOSPITAL_COMMUNITY)
Admission: RE | Admit: 2018-09-21 | Discharge: 2018-09-21 | Disposition: A | Discharge status: Home / Self Care | Payer: Managed Care, Other (non HMO) | Source: Ambulatory Visit | Attending: Family | Admitting: Family

## 2018-09-21 DIAGNOSIS — B20 Human immunodeficiency virus [HIV] disease: Secondary | ICD-10-CM

## 2018-09-22 LAB — URINE CYTOLOGY ANCILLARY ONLY
Chlamydia: NEGATIVE
Neisseria Gonorrhea: NEGATIVE

## 2018-09-22 LAB — T-HELPER CELL (CD4) - (RCID CLINIC ONLY)
CD4 % Helper T Cell: 40 % (ref 33–65)
CD4 T Cell Abs: 946 /uL (ref 400–1790)

## 2018-09-26 LAB — HIV-1 RNA QUANT-NO REFLEX-BLD
HIV 1 RNA Quant: <20 copies/mL
HIV-1 RNA Quant, Log: <1.3 Log copies/mL

## 2018-09-26 LAB — COMPREHENSIVE METABOLIC PANEL
AG Ratio: 1.7 (calc) (ref 1.0–2.5)
ALT: 27 U/L (ref 9–46)
AST: 24 U/L (ref 10–40)
Albumin: 4.5 g/dL (ref 3.6–5.1)
Alkaline phosphatase (APISO): 85 U/L (ref 36–130)
BUN/Creatinine Ratio: 13 (calc) (ref 6–22)
BUN: 18 mg/dL (ref 7–25)
CO2: 27 mmol/L (ref 20–32)
Calcium: 9.7 mg/dL (ref 8.6–10.3)
Chloride: 109 mmol/L (ref 98–110)
Creat: 1.42 mg/dL — ABNORMAL HIGH (ref 0.60–1.35)
Globulin: 2.7 g/dL (calc) (ref 1.9–3.7)
Glucose, Bld: 65 mg/dL (ref 65–99)
Potassium: 4.2 mmol/L (ref 3.5–5.3)
Sodium: 142 mmol/L (ref 135–146)
Total Bilirubin: 0.5 mg/dL (ref 0.2–1.2)
Total Protein: 7.2 g/dL (ref 6.1–8.1)

## 2018-09-26 LAB — CBC
HCT: 47.7 % (ref 38.5–50.0)
Hemoglobin: 16.4 g/dL (ref 13.2–17.1)
MCH: 30 pg (ref 27.0–33.0)
MCHC: 34.4 g/dL (ref 32.0–36.0)
MCV: 87.4 fL (ref 80.0–100.0)
MPV: 9.7 fL (ref 7.5–12.5)
Platelets: 309 10*3/uL (ref 140–400)
RBC: 5.46 10*6/uL (ref 4.20–5.80)
RDW: 13 % (ref 11.0–15.0)
WBC: 6.1 10*3/uL (ref 3.8–10.8)

## 2018-09-26 LAB — LIPID PANEL
Cholesterol: 157 mg/dL (ref <200)
HDL: 51 mg/dL (ref >=40)
LDL Cholesterol (Calc): 81 mg/dL (calc)
Non-HDL Cholesterol (Calc): 106 mg/dL (calc) (ref <130)
Total CHOL/HDL Ratio: 3.1 (calc) (ref <5.0)
Triglycerides: 152 mg/dL — ABNORMAL HIGH (ref <150)

## 2018-09-26 LAB — RPR TITER: RPR Titer: 1:4 {titer} — ABNORMAL HIGH

## 2018-09-26 LAB — FLUORESCENT TREPONEMAL AB(FTA)-IGG-BLD: Fluorescent Treponemal ABS: REACTIVE — AB

## 2018-09-26 LAB — RPR: RPR Ser Ql: REACTIVE — AB

## 2018-10-05 ENCOUNTER — Telehealth: Payer: Self-pay | Admitting: Family

## 2018-10-05 NOTE — Telephone Encounter (Signed)
COVID-19 Pre-Screening Questions: ° °Do you currently have a fever (>100 °F), chills or unexplained body aches?no  ° °Are you currently experiencing new cough, shortness of breath, sore throat, runny nose? No  °•  °Have you recently travelled outside the state of [place] in the last 14 days?no   °•  °Have you been in contact with someone that is currently pending confirmation of Covid19 testing or has been confirmed to have the Covid19 virus?  No  °

## 2018-10-09 ENCOUNTER — Ambulatory Visit: Payer: Managed Care, Other (non HMO) | Admitting: Family

## 2018-10-09 ENCOUNTER — Encounter: Payer: Self-pay | Admitting: Family

## 2018-10-09 ENCOUNTER — Other Ambulatory Visit (HOSPITAL_COMMUNITY)
Admission: RE | Admit: 2018-10-09 | Discharge: 2018-10-09 | Disposition: A | Discharge status: Home / Self Care | Payer: Managed Care, Other (non HMO) | Source: Ambulatory Visit | Attending: Family | Admitting: Family

## 2018-10-09 ENCOUNTER — Other Ambulatory Visit: Payer: Self-pay

## 2018-10-09 VITALS — BP 126/80 | HR 85 | Temp 97.9°F | Wt 169.0 lb

## 2018-10-09 DIAGNOSIS — Z113 Encounter for screening for infections with a predominantly sexual mode of transmission: Secondary | ICD-10-CM

## 2018-10-09 DIAGNOSIS — Z0000 Encounter for general adult medical examination without abnormal findings: Secondary | ICD-10-CM | POA: Diagnosis not present

## 2018-10-09 DIAGNOSIS — Z720 Tobacco use: Secondary | ICD-10-CM

## 2018-10-09 DIAGNOSIS — B20 Human immunodeficiency virus [HIV] disease: Secondary | ICD-10-CM | POA: Diagnosis not present

## 2018-10-09 MED ORDER — BICTEGRAVIR-EMTRICITAB-TENOFOV 50-200-25 MG PO TABS: 1.0000 | ORAL_TABLET | Freq: Every day | ORAL | 5 refills | Status: DC

## 2018-10-09 NOTE — Assessment & Plan Note (Signed)
Continues to smoke approximately 1 pack of cigarettes per week.  Discussed importance of tobacco cessation to reduce risk of cardiovascular, respiratory, or malignant disease in the future.  He continues to work on quitting and is in the contemplation stage.  Continue to monitor.

## 2018-10-09 NOTE — Progress Notes (Signed)
Subjective:    Patient ID: Ronnie Patterson, male    DOB: 1995-05-30, 22 y.o.   MRN: [id number]  No chief complaint on file.    HPI:  Ronnie Patterson is a 23 y.o. male with HIV disease and history of syphilis last seen in the office on 05/23/2018 with good adherence and tolerance to his ART regimen of Biktarvy.  Viral load was undetectable with CD4 count of 1740.  Most recent blood work completed on 09/21/2018 with viral load remaining undetectable and CD4 count of 946.  RPR remains serofast at 1: 4 (previously treated at 1: 32).  Creatinine slightly elevated at 1.42 from previous 1.28.  Hepatic function and electrolytes within normal ranges.  Cholesterol panel with LDL of 81, triglycerides 152, and HDL of 51.  Healthcare maintenance is up-to-date at present.  Ronnie Patterson has been taking his Biktarvy as prescribed no adverse side effects or missed doses.  Feeling very well today with no complaints. Denies fevers, chills, night sweats, headaches, changes in vision, neck pain/stiffness, nausea, diarrhea, vomiting, lesions or rashes.  Ronnie Patterson has no problems obtaining his medication from the pharmacy and remains covered through [employer].  He has just returned to work this week.  Denies feelings of being down, depressed, or hopeless recently.  He has decreased his marijuana usage to about 2 times per week and still uses alcohol socially.  He continues to smoke about a pack of cigarettes lasting him 1 week and working on quitting.  Denies recreational or illicit drug use.  Remains sexually active and requesting condoms.   No Known Allergies    Outpatient Medications Prior to Visit  Medication Sig Dispense Refill  . bismuth subsalicylate (PEPTO BISMOL) 262 MG/15ML suspension Take 30 mLs by mouth every 6 (six) hours as needed for indigestion.    Marland Kitchen ibuprofen (ADVIL,MOTRIN) 200 MG tablet Take 400 mg by mouth every 6 (six) hours as needed for moderate pain.    Marland Kitchen BIKTARVY 50-200-25 MG TABS  tablet TAKE 1 TABLET BY MOUTH DAILY. 30 tablet 4   No facility-administered medications prior to visit.      Past Medical History:  Diagnosis Date  . HIV infection ([place])      History reviewed. No pertinent surgical history.     Review of Systems  Constitutional: Negative for appetite change, chills, fatigue, fever and unexpected weight change.  Eyes: Negative for visual disturbance.  Respiratory: Negative for cough, chest tightness, shortness of breath and wheezing.   Cardiovascular: Negative for chest pain and leg swelling.  Gastrointestinal: Negative for abdominal pain, constipation, diarrhea, nausea and vomiting.  Genitourinary: Negative for dysuria, flank pain, frequency, genital sores, hematuria and urgency.  Skin: Negative for rash.  Allergic/Immunologic: Negative for immunocompromised state.  Neurological: Negative for dizziness and headaches.      Objective:    BP 126/80   Pulse 85   Temp 97.9 F (36.6 C)   Wt 169 lb (76.7 kg)   BMI 24.25 kg/m  Nursing note and vital signs reviewed.  Physical Exam Constitutional:      General: He is not in acute distress.    Appearance: He is well-developed.  Eyes:     Conjunctiva/sclera: Conjunctivae normal.  Neck:     Musculoskeletal: Neck supple.  Cardiovascular:     Rate and Rhythm: Normal rate and regular rhythm.     Heart sounds: Normal heart sounds. No murmur. No friction rub. No gallop.   Pulmonary:     Effort: Pulmonary effort  is normal. No respiratory distress.     Breath sounds: Normal breath sounds. No wheezing or rales.  Chest:     Chest wall: No tenderness.  Abdominal:     General: Bowel sounds are normal.     Palpations: Abdomen is soft.     Tenderness: There is no abdominal tenderness.  Lymphadenopathy:     Cervical: No cervical adenopathy.  Skin:    General: Skin is warm and dry.     Findings: No rash.  Neurological:     Mental Status: He is alert and oriented to person, place, and time.   Psychiatric:        Behavior: Behavior normal.        Thought Content: Thought content normal.        Judgment: Judgment normal.        Assessment & Plan:   Problem List Items Addressed This Visit      Other   HIV disease (HCC) - Primary    Ronnie Patterson has well-controlled HIV disease with good adherence and tolerance to his ART regimen of Biktarvy.  He has no signs/symptoms of opportunistic infection or progressive HIV disease.  He has no problems obtaining his medication from the pharmacy.  Continue current dose of Biktarvy.  Plan for follow-up in 4 months or sooner if needed with lab work 1 to 2 weeks prior to appointment.      Relevant Medications   bictegravir-emtricitabine-tenofovir AF (BIKTARVY) 50-200-25 MG TABS tablet   Other Relevant Orders   T-helper cell (CD4)- ([facility] clinic only)   HIV-1 RNA quant-no reflex-bld   Comprehensive metabolic panel   CBC   Healthcare maintenance     All immunizations are up-to-date per recommendations.  Discussed importance of safe sexual practice to reduce risk of acquisition/transmission of STI.  Check oral and anal swabs for gonorrhea/chlamydia today.  Awaiting dental referral which remains on hold secondary to coronavirus pandemic.      Screening for STDs (sexually transmitted diseases)   Relevant Orders   Cytology (oral, anal, urethral) ancillary only   Cytology (oral, anal, urethral) ancillary only   RPR   Urine cytology ancillary only([place])   Tobacco use    Continues to smoke approximately 1 pack of cigarettes per week.  Discussed importance of tobacco cessation to reduce risk of cardiovascular, respiratory, or malignant disease in the future.  He continues to work on quitting and is in the contemplation stage.  Continue to monitor.          I have changed Ronnie Patterson's Biktarvy to bictegravir-emtricitabine-tenofovir AF. I am also having him maintain his bismuth subsalicylate and ibuprofen.   Meds ordered this  encounter  Medications  . bictegravir-emtricitabine-tenofovir AF (BIKTARVY) 50-200-25 MG TABS tablet    Sig: Take 1 tablet by mouth daily.    Dispense:  30 tablet    Refill:  5    Order Specific Question:   Supervising Provider    Answer:   Judyann MunsonSNIDER, CYNTHIA [[id number]]     Follow-up: Return in about 4 months (around 02/08/2019), or if symptoms worsen or fail to improve.   Marcos EkeGreg Calone, MSN, FNP-C Nurse Practitioner [facility] Main number: [phone]

## 2018-10-09 NOTE — Patient Instructions (Addendum)
Nice to see you!  Please continue to take your [place] as prescribed.  Refills have been sent to the pharmacy.   We will call you with the results of your testing.  Plan for follow-up in 4 months or sooner if needed with lab work 1 to 2 weeks prior to appointment.  Have a great day and stay safe!  Enjoy your summer!

## 2018-10-09 NOTE — Assessment & Plan Note (Signed)
   All immunizations are up-to-date per recommendations.  Discussed importance of safe sexual practice to reduce risk of acquisition/transmission of STI.  Check oral and anal swabs for gonorrhea/chlamydia today.  Awaiting dental referral which remains on hold secondary to coronavirus pandemic.

## 2018-10-09 NOTE — Assessment & Plan Note (Signed)
Ronnie Patterson has well-controlled HIV disease with good adherence and tolerance to his ART regimen of Biktarvy.  He has no signs/symptoms of opportunistic infection or progressive HIV disease.  He has no problems obtaining his medication from the pharmacy.  Continue current dose of Biktarvy.  Plan for follow-up in 4 months or sooner if needed with lab work 1 to 2 weeks prior to appointment.

## 2018-10-10 LAB — CYTOLOGY, (ORAL, ANAL, URETHRAL) ANCILLARY ONLY
Chlamydia: NEGATIVE
Chlamydia: NEGATIVE
Neisseria Gonorrhea: NEGATIVE
Neisseria Gonorrhea: NEGATIVE

## 2018-12-07 ENCOUNTER — Other Ambulatory Visit: Payer: Self-pay

## 2018-12-07 ENCOUNTER — Emergency Department (HOSPITAL_COMMUNITY): Payer: Managed Care, Other (non HMO)

## 2018-12-07 ENCOUNTER — Encounter (HOSPITAL_COMMUNITY): Payer: Self-pay | Admitting: Emergency Medicine

## 2018-12-07 ENCOUNTER — Emergency Department (HOSPITAL_COMMUNITY)
Admission: EM | Admit: 2018-12-07 | Discharge: 2018-12-07 | Disposition: A | Discharge status: Home / Self Care | Payer: Managed Care, Other (non HMO) | Attending: Emergency Medicine | Admitting: Emergency Medicine

## 2018-12-07 DIAGNOSIS — Y92008 Other place in unspecified non-institutional (private) residence as the place of occurrence of the external cause: Secondary | ICD-10-CM | POA: Insufficient documentation

## 2018-12-07 DIAGNOSIS — R569 Unspecified convulsions: Secondary | ICD-10-CM | POA: Diagnosis not present

## 2018-12-07 DIAGNOSIS — Z21 Asymptomatic human immunodeficiency virus [HIV] infection status: Secondary | ICD-10-CM | POA: Insufficient documentation

## 2018-12-07 DIAGNOSIS — F17210 Nicotine dependence, cigarettes, uncomplicated: Secondary | ICD-10-CM | POA: Diagnosis not present

## 2018-12-07 DIAGNOSIS — Y9301 Activity, walking, marching and hiking: Secondary | ICD-10-CM | POA: Diagnosis not present

## 2018-12-07 DIAGNOSIS — S300XXA Contusion of lower back and pelvis, initial encounter: Secondary | ICD-10-CM | POA: Insufficient documentation

## 2018-12-07 DIAGNOSIS — S3992XA Unspecified injury of lower back, initial encounter: Secondary | ICD-10-CM | POA: Diagnosis present

## 2018-12-07 DIAGNOSIS — Z79899 Other long term (current) drug therapy: Secondary | ICD-10-CM | POA: Diagnosis not present

## 2018-12-07 DIAGNOSIS — W108XXA Fall (on) (from) other stairs and steps, initial encounter: Secondary | ICD-10-CM | POA: Insufficient documentation

## 2018-12-07 DIAGNOSIS — W19XXXA Unspecified fall, initial encounter: Secondary | ICD-10-CM

## 2018-12-07 DIAGNOSIS — Y999 Unspecified external cause status: Secondary | ICD-10-CM | POA: Diagnosis not present

## 2018-12-07 LAB — CBC
HCT: 50.2 % (ref 39.0–52.0)
Hemoglobin: 16.6 g/dL (ref 13.0–17.0)
MCH: 30.2 pg (ref 26.0–34.0)
MCHC: 33.1 g/dL (ref 30.0–36.0)
MCV: 91.4 fL (ref 80.0–100.0)
Platelets: 276 10*3/uL (ref 150–400)
RBC: 5.49 MIL/uL (ref 4.22–5.81)
RDW: 12.8 % (ref 11.5–15.5)
WBC: 8.9 10*3/uL (ref 4.0–10.5)
nRBC: 0 % (ref 0.0–0.2)

## 2018-12-07 LAB — BASIC METABOLIC PANEL
Anion gap: 9 (ref 5–15)
BUN: 14 mg/dL (ref 6–20)
CO2: 24 mmol/L (ref 22–32)
Calcium: 9.1 mg/dL (ref 8.9–10.3)
Chloride: 108 mmol/L (ref 98–111)
Creatinine, Ser: 1.12 mg/dL (ref 0.61–1.24)
GFR calc Af Amer: >60 mL/min (ref >60)
GFR calc non Af Amer: >60 mL/min (ref >60)
Glucose, Bld: 91 mg/dL (ref 70–99)
Potassium: 3.8 mmol/L (ref 3.5–5.1)
Sodium: 141 mmol/L (ref 135–145)

## 2018-12-07 LAB — RAPID URINE DRUG SCREEN, HOSP PERFORMED
Amphetamines: NOT DETECTED
Barbiturates: NOT DETECTED
Benzodiazepines: NOT DETECTED
Cocaine: NOT DETECTED
Opiates: NOT DETECTED
Tetrahydrocannabinol: POSITIVE — AB

## 2018-12-07 LAB — URINALYSIS, ROUTINE W REFLEX MICROSCOPIC
Bacteria, UA: NONE SEEN
Bilirubin Urine: NEGATIVE
Glucose, UA: NEGATIVE mg/dL
Ketones, ur: NEGATIVE mg/dL
Leukocytes,Ua: NEGATIVE
Nitrite: NEGATIVE
Protein, ur: NEGATIVE mg/dL
Specific Gravity, Urine: 1.006 (ref 1.005–1.030)
pH: 5 (ref 5.0–8.0)

## 2018-12-07 LAB — ETHANOL: Alcohol, Ethyl (B): 79 mg/dL — ABNORMAL HIGH (ref <10)

## 2018-12-07 NOTE — ED Notes (Signed)
Pt states was drinking feel down some stairs and had a possible seizure  Pt started to vagal down during iv placement  Pt repositioned  V/s improving  Will continue to monitor.

## 2018-12-07 NOTE — ED Triage Notes (Signed)
Patient complaining of having a seizure. Patient states he had a few drinks with friends. The patient states he went outside to smoke. Patient states that he fell downstairs and was convulsing. Patient states  This is what his friends told him. Patient states about 6-8 months ago he was convulsing and his friends told him. Patient states that his dad has seizures.

## 2018-12-07 NOTE — ED Provider Notes (Signed)
[facility]-EMERGENCY DEPT Provider Note   CSN: [id number] Arrival date & time: 12/07/18  0118    History   Chief Complaint Chief Complaint  Patient presents with   Seizure    HPI Ronnie Patterson is a 23 y.o. male with history of well-controlled HIV disease, good adherence to his ART regimen, previous syphilis s/p full treatment, tobacco use, vasovagal syncope presents to the ER for evaluation of possible convulsions.  Patient states that last night he was at a friend's house hanging out when he stepped outside to smoke a cigarette.  He remembers walking outside and starting to walk down some steps and starting to fall.  After that he remembers feeling like he was in a dream and started hearing the voices of his friends calling his name.  He remembers looking up and seeing them hovering over him while he was on the ground.  He reports mild right buttock pain and some abrasions to his feet bilaterally that he suspects is from the fall.  He does not think he hit his head but does not specifically remember.  He has a mild generalized headache since the fall.  No anticoagulants, no vision changes or loss, neck pain, extremity injury.  Patient's friends told him that they were calling out his name for a couple of minutes and since he did not respond they came outside and they saw him on the ground at the base of the steps in fetal position "convulsing" for maybe 3 to 4 minutes.  He slowly came to and was a little groggy but eventually became responsive and fully oriented.  States that 1 year ago patient had a somewhat similar episode.  He was at the doctor's office and had just gotten some shots in his buttock for syphilis in 2 to 3 minutes after that he started convulsing.  His friend told him that he was making a growling noise.  He denies tongue biting, loss of bladder or bowel control during these episodes.  He drinks 4-6 beers usually 3 times a week.  Occasional marijuana use.   Denies other illicit drug use.  Denies history of withdrawal symptoms when he stops drinking and can go several days without having alcohol.  No recent medication changes.  He only takes Radio producerBiktarvy for HIV and has been compliant.  Denies illicit drug use.  Denies fever, severe headache, neck pain or stiffness, abnormal rash.  He denies any stroke symptoms.  States his dad has long history of seizures, unsure of specific type but was told absence and possibly epileptic seizures.     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 No pertinent surgical history.      Home Medications    Prior to Admission medications   Medication Sig Start Date End Date Taking? Authorizing Provider  bictegravir-emtricitabine-tenofovir AF (BIKTARVY) 50-200-25 MG TABS tablet Take 1 tablet by mouth daily. 10/09/18  Yes Veryl Speakalone, Gregory D, FNP  bismuth subsalicylate (PEPTO BISMOL) 262 MG/15ML suspension Take 30 mLs by mouth every 6 (six) hours as needed for indigestion.   Yes [provider]  ibuprofen (ADVIL,MOTRIN) 200 MG tablet Take 400 mg by mouth every  6 (six) hours as needed for moderate pain.   Yes [provider]    Family History History reviewed. No pertinent family history.  Social History Social History   Tobacco Use   Smoking status: Current Every Day Smoker    Packs/day: 0.50    Years: 4.00    Pack years: 2.00    Types: Cigarettes, E-cigarettes   Smokeless tobacco: Never Used  Substance Use Topics   Alcohol use: Yes    Comment: occassionally    Drug use: Yes    Frequency: 2.0 times per week    Types: Marijuana     Allergies   Patient has no  known allergies.   Review of Systems Review of Systems  Skin: Positive for color change.  Neurological: Positive for seizures and headaches.  All other systems reviewed and are negative.    Physical Exam Updated Vital Signs BP 130/84 (BP Location: Right Arm)    Pulse 84    Temp 98.4 F (36.9 C) (Oral)    Resp (!) 23    Ht 5\' 9"  (1.753 m)    Wt 76.7 kg    SpO2 96%    BMI 24.96 kg/m   Physical Exam Constitutional:      General: He is not in acute distress.    Appearance: He is well-developed.  HENT:     Head: Atraumatic.     Comments: No facial, nasal, scalp bone tenderness. No obvious contusions or skin abrasions.     Ears:     Comments: No Battle's sign.    Nose:     Comments: No intranasal bleeding or rhinorrhea. Septum midline    Mouth/Throat:     Comments: No intraoral/tongue bleeding or injury. No malocclusion. MMM. Dentition appears stable.  Eyes:     Conjunctiva/sclera: Conjunctivae normal.     Comments: Lids normal. EOMs and PERRL intact. No racoon's eyes   Neck:     Comments: C-spine: no midline or paraspinal muscular tenderness. Full active ROM of cervical spine w/o pain. Trachea midline Cardiovascular:     Rate and Rhythm: Normal rate and regular rhythm.     Pulses:          Radial pulses are 1+ on the right side and 1+ on the left side.       Dorsalis pedis pulses are 1+ on the right side and 1+ on the left side.     Heart sounds: Normal heart sounds, S1 normal and S2 normal.  Pulmonary:     Effort: Pulmonary effort is normal.     Breath sounds: Normal breath sounds. No decreased breath sounds.  Abdominal:     Palpations: Abdomen is soft.     Tenderness: There is no abdominal tenderness.     Comments: No guarding. No seatbelt sign.   Musculoskeletal: Normal range of motion.        General: No deformity.     Comments: T-spine: no paraspinal muscular tenderness or midline tenderness.   L-spine: no paraspinal muscular or midline tenderness.  Pelvis: no  instability with AP/L compression, leg shortening or rotation. Full PROM of hips bilaterally without pain. Negative SLR bilaterally.   Skin:    General: Skin is warm and dry.     Capillary Refill: Capillary refill takes less than 2 seconds.     Findings: Abrasion and ecchymosis present.          Comments: Purple ecchymosis approximately 6 cm on the right top buttock, locally tender.  Multiple superficial  abrasions to the ankles bilaterally.  Several linear scars to left anterior forearm.  Neurological:     Mental Status: He is alert, oriented to person, place, and time and easily aroused.     Comments:  Alert and oriented to self, place, time and event.  Speech is fluent without dysarthria or dysphasia. Strength 5/5 with hand grip and ankle F/E.   Sensation to light touch intact in face, hands and feet. Normal gait No pronator drift. No leg drop. Normal finger-to-nose and feet tapping.  CN I not tested CN II grossly intact visual fields bilaterally. Unable to visualize posterior eye. CN III, IV, VI PEERL and EOMs intact bilaterally CN V light touch intact in all 3 divisions of trigeminal nerve CN VII facial movements symmetric CN VIII not tested CN IX, X no uvula deviation, symmetric rise of soft palate  CN XI 5/5 SCM and trapezius strength bilaterally  CN XII Midline tongue protrusion, symmetric L/R movements  Psychiatric:        Behavior: Behavior normal. Behavior is cooperative.        Thought Content: Thought content normal.      ED Treatments / Results  Labs (all labs ordered are listed, but only abnormal results are displayed) Labs Reviewed  URINALYSIS, ROUTINE W REFLEX MICROSCOPIC - Abnormal; Notable for the following components:      Result Value   Color, Urine STRAW (*)    Hgb urine dipstick SMALL (*)    All other components within normal limits  ETHANOL - Abnormal; Notable for the following components:   Alcohol, Ethyl (B) 79 (*)    All other components within  normal limits  RAPID URINE DRUG SCREEN, HOSP PERFORMED - Abnormal; Notable for the following components:   Tetrahydrocannabinol POSITIVE (*)    All other components within normal limits  BASIC METABOLIC PANEL  CBC    EKG EKG Interpretation  Date/Time:  Thursday December 07 2018 06:39:07 EDT Ventricular Rate:  69 PR Interval:    QRS Duration: 80 QT Interval:  391 QTC Calculation: 419 R Axis:   87 Text Interpretation:  Sinus arrhythmia No previous ECGs available Confirmed by Paula LibraMolpus, John ([id number]) on 12/07/2018 7:02:24 AM   Radiology Dg Sacrum/coccyx  Result Date: 12/07/2018 CLINICAL DATA:  Coccygeal pain after a fall down stairs last night. Initial encounter. EXAM: SACRUM AND COCCYX - 2+ VIEW COMPARISON:  None. FINDINGS: There is no evidence of fracture or other focal bone lesions. IMPRESSION: Negative exam. Electronically Signed   By: Drusilla Kannerhomas  Dalessio M.D.   On: 12/07/2018 07:48   Ct Head Wo Contrast  Result Date: 12/07/2018 CLINICAL DATA:  Seizure, new, nontraumatic EXAM: CT HEAD WITHOUT CONTRAST TECHNIQUE: Contiguous axial images were obtained from the base of the skull through the vertex without intravenous contrast. COMPARISON:  None. FINDINGS: Brain: No evidence of infarction, hemorrhage, hydrocephalus, extra-axial collection or mass lesion/mass effect. Vascular: No hyperdense vessel or unexpected calcification. Skull: Normal. Negative for fracture or focal lesion. Sinuses/Orbits: No acute finding. IMPRESSION: Negative head CT Electronically Signed   By: Marnee SpringJonathon  Watts M.D.   On: 12/07/2018 06:30    Procedures Procedures (including critical care time)  Medications Ordered in ED Medications - No data to display   Initial Impression / Assessment and Plan / ED Course  I have reviewed the triage vital signs and the nursing notes.  Pertinent labs & imaging results that were available during my care of the patient were reviewed by me and considered in my medical decision making (  see  chart for details).  23 year old here for possible convulsion/seizure activity.  This is his second episode in the last year.  It sounds like first episode was after getting some shots which could be trigger.  No identifiable trigger today although he had some EtOH on board which could lower his threshold.  Also reports strong history of seizure in father.  Patient denies prodrome.  Unclear if there was postictal.  But no tongue/intraoral injury, loss of bladder or bowel control.  This could be epileptic seizure versus other.  He has a history of well-controlled HIV with CD4 count greater than 200 and CNS involvement/lesion is highly unlikely.  He has no severe headache, meningismus, fever and infectious process is unlikely.  He drinks EtOH 3 times a week but has never had any withdrawal symptoms or seizures.  Denies illicit drug use otherwise.  Positive THC in urine which could also be contributing to lowering his threshold.  Exam as above is benign, no significant signs of trauma.  No neuro deficits.  HD stable, VS WNL.  Triage work-up reviewed by me is benign.  No significant electrolyte abnormalities.  His EKG is without arrhythmias.  Head CT obtained at triage is without any intracranial hemorrhage, mass occupying lesions.  Patient has been reevaluated and there is been no clinical decline.  No repeat seizure.  His initial reported convulsion episode was less than 5 minutes.  Given benign history, exam and work-up today appropriate for discharge.  He was given seizure precautions.  He was instructed to follow-up with neurology as soon as possible for formal work-up of possible seizure activity. Symptomatic treatment for buttock contusion. Return precautions discussed.  Patient is comfortable with this.  Final Clinical Impressions(s) / ED Diagnoses   Final diagnoses:  Fall, initial encounter  Seizure-like activity [facility](HCC)  Traumatic ecchymosis of buttock, initial encounter    ED Discharge  Orders    None       Liberty HandyGibbons, Khori Underberg J, PA-C 12/07/18 [id number]    Vanetta MuldersZackowski, Scott, MD 12/17/18 [phone]

## 2018-12-07 NOTE — Discharge Instructions (Addendum)
You were seen in the ER for possible convulsions or seizure like activity  Lab work, CT head, tail bone x-rays, EKG and other work up today was normal  We have ruled out life threatening causes of seizures  Advanced or poorly controlled HIV can predispose you to central nervous system (brain spinal, spinal cord) lesions that can cause seizures however you have well controlled HIV and this is highly unlikely  Further work up needs to be done by neurology as soon as possible. They may do more labs, EEGs and possibly start medicines  Return to the ER for repeat seizure, seizure or convulsion lasting more than 4-5 minutes, significant trauma from seizure or convulsion, fever, severe headache, neck stiffness, abnormal rash, stroke symptoms  Seizure precautions -- Please avoid unsupervised activities that might pose danger with sudden loss of consciousness, including bathing, swimming alone, working at heights, and operating heavy machinery. The bathtub is the most common site of seizure-induced drowning, and patients with convulsions should be told to take showers instead of baths.  Driving -- States and countries vary in Teacher, adult education requirements for patients with an episode of loss of consciousness, including from seizures and epilepsy, as well as the responsibilities of clinicians to notify state authorities. Most if not all require at least some period of abstinence from driving after a seizure or other event associated with loss or alteration of consciousness. Please do not drive until evaluated by neurologist.

## 2018-12-07 NOTE — ED Notes (Signed)
Discharge instructions reviewed with patient. Patient verbalizes understanding. VSS.   

## 2019-02-08 ENCOUNTER — Other Ambulatory Visit: Payer: Self-pay

## 2019-02-08 ENCOUNTER — Other Ambulatory Visit: Payer: Managed Care, Other (non HMO)

## 2019-02-08 DIAGNOSIS — Z113 Encounter for screening for infections with a predominantly sexual mode of transmission: Secondary | ICD-10-CM

## 2019-02-08 DIAGNOSIS — B20 Human immunodeficiency virus [HIV] disease: Secondary | ICD-10-CM

## 2019-02-09 LAB — T-HELPER CELL (CD4) - (RCID CLINIC ONLY)
CD4 % Helper T Cell: 49 % (ref 33–65)
CD4 T Cell Abs: 1234 /uL (ref 400–1790)

## 2019-02-13 LAB — CBC
HCT: 46.9 % (ref 38.5–50.0)
Hemoglobin: 15.4 g/dL (ref 13.2–17.1)
MCH: 30.1 pg (ref 27.0–33.0)
MCHC: 32.8 g/dL (ref 32.0–36.0)
MCV: 91.6 fL (ref 80.0–100.0)
MPV: 10.1 fL (ref 7.5–12.5)
Platelets: 287 10*3/uL (ref 140–400)
RBC: 5.12 10*6/uL (ref 4.20–5.80)
RDW: 12.5 % (ref 11.0–15.0)
WBC: 6.2 10*3/uL (ref 3.8–10.8)

## 2019-02-13 LAB — HIV-1 RNA QUANT-NO REFLEX-BLD
HIV 1 RNA Quant: <20 copies/mL
HIV-1 RNA Quant, Log: <1.3 Log copies/mL

## 2019-02-13 LAB — COMPREHENSIVE METABOLIC PANEL
AG Ratio: 1.8 (calc) (ref 1.0–2.5)
ALT: 36 U/L (ref 9–46)
AST: 29 U/L (ref 10–40)
Albumin: 4.4 g/dL (ref 3.6–5.1)
Alkaline phosphatase (APISO): 56 U/L (ref 36–130)
BUN: 16 mg/dL (ref 7–25)
CO2: 28 mmol/L (ref 20–32)
Calcium: 9.3 mg/dL (ref 8.6–10.3)
Chloride: 110 mmol/L (ref 98–110)
Creat: 1.34 mg/dL (ref 0.60–1.35)
Globulin: 2.4 g/dL (calc) (ref 1.9–3.7)
Glucose, Bld: 78 mg/dL (ref 65–99)
Potassium: 4.3 mmol/L (ref 3.5–5.3)
Sodium: 144 mmol/L (ref 135–146)
Total Bilirubin: 0.6 mg/dL (ref 0.2–1.2)
Total Protein: 6.8 g/dL (ref 6.1–8.1)

## 2019-02-13 LAB — RPR TITER: RPR Titer: 1:4 {titer} — ABNORMAL HIGH

## 2019-02-13 LAB — FLUORESCENT TREPONEMAL AB(FTA)-IGG-BLD: Fluorescent Treponemal ABS: REACTIVE — AB

## 2019-02-13 LAB — RPR: RPR Ser Ql: REACTIVE — AB

## 2019-02-26 ENCOUNTER — Encounter: Payer: Self-pay | Admitting: Family

## 2019-02-26 ENCOUNTER — Other Ambulatory Visit: Payer: Self-pay

## 2019-02-26 ENCOUNTER — Ambulatory Visit (INDEPENDENT_AMBULATORY_CARE_PROVIDER_SITE_OTHER): Payer: Managed Care, Other (non HMO) | Admitting: Family

## 2019-02-26 VITALS — BP 121/82 | HR 90 | Temp 97.7°F | Wt 163.0 lb

## 2019-02-26 DIAGNOSIS — Z113 Encounter for screening for infections with a predominantly sexual mode of transmission: Secondary | ICD-10-CM

## 2019-02-26 DIAGNOSIS — Z23 Encounter for immunization: Secondary | ICD-10-CM

## 2019-02-26 DIAGNOSIS — B20 Human immunodeficiency virus [HIV] disease: Secondary | ICD-10-CM

## 2019-02-26 DIAGNOSIS — Z0000 Encounter for general adult medical examination without abnormal findings: Secondary | ICD-10-CM

## 2019-02-26 MED ORDER — BIKTARVY 50-200-25 MG PO TABS: 1.0000 | ORAL_TABLET | Freq: Every day | ORAL | 5 refills | Status: DC

## 2019-02-26 NOTE — Assessment & Plan Note (Signed)
   Influenza, Menveo, and Gardasil updated today.  Will be due for second dose of Gardasil in 1 month and third dose in 6 months.  Discussed importance of safe sexual practice to reduce risk of acquisition/transmission of STI.  Condoms provided per request.  Dental screening is up-to-date per recommendations.

## 2019-02-26 NOTE — Progress Notes (Signed)
Subjective:    Patient ID: Ronnie Patterson, male    DOB: [birth year], 23 y.o.   MRN: [id number]  Chief Complaint  Patient presents with  . HIV Positive/AIDS     HPI:  Ronnie Patterson is a 23 y.o. male with HIV disease who was last seen in the office on 10/09/2018 with good adherence and tolerance to his ART regimen Biktarvy.  His viral load at the time was undetectable and CD4 count was 1740.  His RPR was serofast at 1: 4 (previously treated at 1: 32) and his creatinine was slightly elevated from previous.  Most recent blood work completed on 02/08/2019 with viral load that remains undetectable and CD4 count of 1234.  RPR remains serofast at 1: 4.  Kidney function with creatinine of 1.34 which is stable and normal liver function and electrolytes.  Healthcare maintenance due includes influenza vaccination and second dose of meningococcal/Menveo and tetanus.  [name] continues to take his Biktarvy as prescribed no adverse side effects or missed doses.  Overall feeling well today with no new concerns/complaints.  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 Denies fevers, chills, night sweats, headaches, changes in vision, neck pain/stiffness, nausea, diarrhea, vomiting, lesions or rashes.  [name] has no problems obtaining his medication from the pharmacy and remains covered through [place].  Denies any feelings of being down, depressed, or hopeless recently.  Not currently sexually active and would like condoms.  He does smoke marijuana 2-3 times per week on average.  No tobacco use or alcohol consumption.  He is back in the gym working out.   No Known Allergies    Outpatient Medications Prior to Visit  Medication Sig Dispense Refill  . bismuth subsalicylate (PEPTO BISMOL) 262 MG/15ML suspension Take 30 mLs by mouth every 6 (six) hours as needed for indigestion.    Marland Kitchen ibuprofen (ADVIL,MOTRIN) 200 MG tablet Take 400 mg by mouth every 6 (six) hours as needed for moderate pain.    .  bictegravir-emtricitabine-tenofovir AF (BIKTARVY) 50-200-25 MG TABS tablet Take 1 tablet by mouth daily. 30 tablet 5   No facility-administered medications prior to visit.      Past Medical History:  Diagnosis Date  . HIV infection ([place])      History reviewed. No pertinent surgical history.   Review of Systems  Constitutional: Negative for appetite change, chills, fatigue, fever and unexpected weight change.  Eyes: Negative for visual disturbance.  Respiratory: Negative for cough, chest tightness, shortness of breath and wheezing.   Cardiovascular: Negative for chest pain and leg swelling.  Gastrointestinal: Negative for abdominal pain, constipation, diarrhea, nausea and vomiting.  Genitourinary: Negative for dysuria, flank pain, frequency, genital sores, hematuria and urgency.  Skin: Negative for rash.  Allergic/Immunologic: Negative for immunocompromised state.  Neurological: Negative for dizziness and headaches.      Objective:    BP 121/82   Pulse 90   Temp 97.7 F (36.5 C)   Wt 163 lb (73.9 kg)   BMI 24.07 kg/m  Nursing note and vital signs reviewed.  Physical Exam Constitutional:      General: He is not in acute distress.    Appearance: He is well-developed.  Eyes:     Conjunctiva/sclera: Conjunctivae normal.  Neck:     Musculoskeletal: Neck supple.  Cardiovascular:     Rate and Rhythm: Normal rate and regular rhythm.     Heart sounds: Normal heart sounds. No murmur. No friction rub. No gallop.   Pulmonary:  Effort: Pulmonary effort is normal. No respiratory distress.     Breath sounds: Normal breath sounds. No wheezing or rales.  Chest:     Chest wall: No tenderness.  Abdominal:     General: Bowel sounds are normal.     Palpations: Abdomen is soft.     Tenderness: There is no abdominal tenderness.  Lymphadenopathy:     Cervical: No cervical adenopathy.  Skin:    General: Skin is warm and dry.     Findings: No rash.  Neurological:     Mental  Status: He is alert and oriented to person, place, and time.  Psychiatric:        Behavior: Behavior normal.        Thought Content: Thought content normal.        Judgment: Judgment normal.      Depression screen [facility] 2/9 02/26/2019 05/23/2018 02/10/2018 02/10/2018 11/10/2017  Decreased Interest 0 0 0 0 0  Down, Depressed, Hopeless 0 0 0 0 0  PHQ - 2 Score 0 0 0 0 0       Assessment & Plan:    Patient Active Problem List   Diagnosis Date Noted  . Screening for STDs (sexually transmitted diseases) 10/09/2018  . Tobacco use 10/09/2018  . Healthcare maintenance 05/23/2018  . HIV disease (HCC) 10/12/2017  . 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 Vasovagal syncope 10/12/2017     Problem List Items Addressed This Visit      Other   HIV disease (HCC) - Primary    [name] has well-controlled HIV disease with good adherence and tolerance to his ART regimen of Biktarvy.  No signs/symptoms of opportunistic infection or progressive HIV disease.  Overall feeling well.  Blood work and plan of care reviewed and questions answered.  Continue current dose of Biktarvy.  Plan for follow-up office visit in 4 months or sooner if needed with lab work 1 to 2 weeks prior to appointment.      Relevant Medications   bictegravir-emtricitabine-tenofovir AF (BIKTARVY) 50-200-25 MG TABS tablet   Other Relevant Orders   T-helper cell (CD4)- ([facility] clinic only)   HIV-1 RNA quant-no reflex-bld   Comprehensive metabolic panel   Healthcare maintenance     Influenza, Menveo, and Gardasil updated today.  Will be due for second dose of Gardasil in 1 month and third dose in 6 months.  Discussed importance of safe sexual practice to reduce risk of acquisition/transmission of STI.  Condoms provided per request.  Dental screening is up-to-date per recommendations.      Screening for STDs (sexually transmitted diseases)   Relevant Orders   Urine cytology ancillary only([place])       I am having Klever Twyford maintain his bismuth subsalicylate, ibuprofen, and Biktarvy.   Meds ordered this encounter  Medications  . bictegravir-emtricitabine-tenofovir AF (BIKTARVY) 50-200-25 MG TABS tablet    Sig: Take 1 tablet by mouth daily.    Dispense:  30 tablet    Refill:  5    Order Specific Question:   Supervising Provider    Answer:   Judyann Munson [[id number]]     Follow-up: Return in about 4 months (around 06/29/2019).   Marcos Eke, MSN, FNP-C Nurse Practitioner [facility] for Infectious Disease [facility] Main number: [phone]

## 2019-02-26 NOTE — Assessment & Plan Note (Signed)
Ronnie Patterson has well-controlled HIV disease with good adherence and tolerance to his ART regimen of Biktarvy.  No signs/symptoms of opportunistic infection or progressive HIV disease.  Overall feeling well.  Blood work and plan of care reviewed and questions answered.  Continue current dose of Biktarvy.  Plan for follow-up office visit in 4 months or sooner if needed with lab work 1 to 2 weeks prior to appointment.

## 2019-02-26 NOTE — Patient Instructions (Signed)
Nice to see you.  Please continue to take your [place] as prescribed.  Refills will be sent to the pharmacy.  Please schedule a nurse visit in 1 month for Guardisil vaccination (HPV)  Plan for follow up in 4 months or sooner if needed with lab work 1-2 weeks prior to appointment.  Have a great day and stay safe!

## 2019-02-26 NOTE — Addendum Note (Signed)
Addended by: Lenore Cordia on: 02/26/2019 11:28 AM   Modules accepted: Orders

## 2019-04-02 ENCOUNTER — Ambulatory Visit: Payer: Managed Care, Other (non HMO)

## 2019-06-27 NOTE — Addendum Note (Signed)
Addended by: Mariea Clonts D on: 06/27/2019 09:24 AM   Modules accepted: Orders

## 2019-06-28 ENCOUNTER — Other Ambulatory Visit: Payer: Managed Care, Other (non HMO)

## 2019-06-28 ENCOUNTER — Other Ambulatory Visit: Payer: Self-pay

## 2019-06-28 DIAGNOSIS — B20 Human immunodeficiency virus [HIV] disease: Secondary | ICD-10-CM

## 2019-06-28 DIAGNOSIS — Z113 Encounter for screening for infections with a predominantly sexual mode of transmission: Secondary | ICD-10-CM

## 2019-06-29 LAB — URINE CYTOLOGY ANCILLARY ONLY
Chlamydia: NEGATIVE
Comment: NEGATIVE
Comment: NORMAL
Neisseria Gonorrhea: NEGATIVE

## 2019-06-29 LAB — T-HELPER CELL (CD4) - (RCID CLINIC ONLY)
CD4 % Helper T Cell: 46 % (ref 33–65)
CD4 T Cell Abs: 1103 /uL (ref 400–1790)

## 2019-07-02 LAB — COMPREHENSIVE METABOLIC PANEL
AG Ratio: 2.1 (calc) (ref 1.0–2.5)
ALT: 25 U/L (ref 9–46)
AST: 22 U/L (ref 10–40)
Albumin: 4.2 g/dL (ref 3.6–5.1)
Alkaline phosphatase (APISO): 68 U/L (ref 36–130)
BUN: 10 mg/dL (ref 7–25)
CO2: 24 mmol/L (ref 20–32)
Calcium: 8.5 mg/dL — ABNORMAL LOW (ref 8.6–10.3)
Chloride: 113 mmol/L — ABNORMAL HIGH (ref 98–110)
Creat: 1.08 mg/dL (ref 0.60–1.35)
Globulin: 2 g/dL (calc) (ref 1.9–3.7)
Glucose, Bld: 82 mg/dL (ref 65–99)
Potassium: 4.2 mmol/L (ref 3.5–5.3)
Sodium: 145 mmol/L (ref 135–146)
Total Bilirubin: 0.4 mg/dL (ref 0.2–1.2)
Total Protein: 6.2 g/dL (ref 6.1–8.1)

## 2019-07-02 LAB — HIV-1 RNA QUANT-NO REFLEX-BLD
HIV 1 RNA Quant: <20 copies/mL
HIV-1 RNA Quant, Log: <1.3 Log copies/mL

## 2019-07-23 ENCOUNTER — Encounter: Payer: Self-pay | Admitting: Family

## 2019-07-23 ENCOUNTER — Ambulatory Visit (INDEPENDENT_AMBULATORY_CARE_PROVIDER_SITE_OTHER): Payer: Managed Care, Other (non HMO) | Admitting: Family

## 2019-07-23 ENCOUNTER — Other Ambulatory Visit: Payer: Self-pay

## 2019-07-23 VITALS — BP 131/80 | HR 113 | Temp 98.0°F | Ht 69.0 in | Wt 160.0 lb

## 2019-07-23 DIAGNOSIS — Z0000 Encounter for general adult medical examination without abnormal findings: Secondary | ICD-10-CM | POA: Diagnosis not present

## 2019-07-23 DIAGNOSIS — Z79899 Other long term (current) drug therapy: Secondary | ICD-10-CM | POA: Diagnosis not present

## 2019-07-23 DIAGNOSIS — Z113 Encounter for screening for infections with a predominantly sexual mode of transmission: Secondary | ICD-10-CM

## 2019-07-23 DIAGNOSIS — Z23 Encounter for immunization: Secondary | ICD-10-CM | POA: Diagnosis not present

## 2019-07-23 DIAGNOSIS — B20 Human immunodeficiency virus [HIV] disease: Secondary | ICD-10-CM

## 2019-07-23 MED ORDER — BIKTARVY 50-200-25 MG PO TABS: 1.0000 | ORAL_TABLET | Freq: Every day | ORAL | 5 refills | Status: DC

## 2019-07-23 NOTE — Progress Notes (Signed)
Subjective:    Patient ID: Ronnie Patterson, male    DOB: Sep 27, 1995, 24 y.o.   MRN: [id number]  Chief Complaint  Patient presents with  . Follow-up    B20     HPI:  Ronnie Patterson is a 24 y.o. male with HIV disease who was last seen in the office on 02/26/2019 with good adherence and tolerance to his ART regimen of Biktarvy.  Viral load was undetectable with CD4 count of 1234.  RPR was serofast at 1: 4 (previously treated at 1: 32).  Healthcare maintenance due includes second dose of Gardasil.  Ronnie Patterson continues to take his Biktarvy as prescribed no adverse side effects or missed doses since his last office visit.  Overall feeling well today with no new concerns/complaints. Denies fevers, chills, night sweats, headaches, changes in vision, neck pain/stiffness, nausea, diarrhea, vomiting, lesions or rashes.  Ronnie Patterson has no problems obtaining his medication from the pharmacy and remains covered through [place].  Denies any feelings of being down, depressed, or hopeless recently.  He continues to smoke marijuana approximately 2 times per week with alcohol consumption on occasion.  He does not smoke tobacco.  Declines condoms today.  Continues to work full-time.  Due for routine dental evaluation.  He has received his first dose of the COVID-19 vaccination.   No Known Allergies    Outpatient Medications Prior to Visit  Medication Sig Dispense Refill  . bictegravir-emtricitabine-tenofovir AF (BIKTARVY) 50-200-25 MG TABS tablet Take 1 tablet by mouth daily. 30 tablet 5  . bismuth subsalicylate (PEPTO BISMOL) 262 MG/15ML suspension Take 30 mLs by mouth every 6 (six) hours as needed for indigestion.    Marland Kitchen ibuprofen (ADVIL,MOTRIN) 200 MG tablet Take 400 mg by mouth every 6 (six) hours as needed for moderate pain.     No facility-administered medications prior to visit.     Past Medical History:  Diagnosis Date  . HIV infection ([place])      History reviewed. No pertinent surgical  history.   Review of Systems  Constitutional: Negative for appetite change, chills, fatigue, fever and unexpected weight change.  Eyes: Negative for visual disturbance.  Respiratory: Negative for cough, chest tightness, shortness of breath and wheezing.   Cardiovascular: Negative for chest pain and leg swelling.  Gastrointestinal: Negative for abdominal pain, constipation, diarrhea, nausea and vomiting.  Genitourinary: Negative for dysuria, flank pain, frequency, genital sores, hematuria and urgency.  Skin: Negative for rash.  Allergic/Immunologic: Negative for immunocompromised state.  Neurological: Negative for dizziness and headaches.      Objective:    BP 131/80   Pulse (!) 113   Temp 98 F (36.7 C)   Ht 5\' 9"  (1.753 m)   Wt 160 lb (72.6 kg)   BMI 23.63 kg/m  Nursing note and vital signs reviewed.  Physical Exam Constitutional:      General: He is not in acute distress.    Appearance: He is well-developed.  Eyes:     Conjunctiva/sclera: Conjunctivae normal.  Cardiovascular:     Rate and Rhythm: Normal rate and regular rhythm.     Heart sounds: Normal heart sounds. No murmur. No friction rub. No gallop.   Pulmonary:     Effort: Pulmonary effort is normal. No respiratory distress.     Breath sounds: Normal breath sounds. No wheezing or rales.  Chest:     Chest wall: No tenderness.  Abdominal:     General: Bowel sounds are normal.     Palpations: Abdomen is soft.  Tenderness: There is no abdominal tenderness.  Musculoskeletal:     Cervical back: Neck supple.  Lymphadenopathy:     Cervical: No cervical adenopathy.  Skin:    General: Skin is warm and dry.     Findings: No rash.  Neurological:     Mental Status: He is alert and oriented to person, place, and time.  Psychiatric:        Behavior: Behavior normal.        Thought Content: Thought content normal.        Judgment: Judgment normal.     Depression screen [facility] 2/9 02/26/2019 05/23/2018 02/10/2018  02/10/2018 11/10/2017  Decreased Interest 0 0 0 0 0  Down, Depressed, Hopeless 0 0 0 0 0  PHQ - 2 Score 0 0 0 0 0       Assessment & Plan:    Patient Active Problem List   Diagnosis Date Noted  . Screening for STDs (sexually transmitted diseases) 10/09/2018  . Tobacco use 10/09/2018  . Healthcare maintenance 05/23/2018  . HIV disease (HCC) 10/12/2017  . 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 Vasovagal syncope 10/12/2017     Problem List Items Addressed This Visit      Other   HIV disease [facility]) - Primary    Mr. Mcelwain continues to have well-controlled HIV disease with good adherence and tolerance to his ART regimen of Biktarvy.  No signs/symptoms of opportunistic infection or progressive HIV disease.  We reviewed his lab work and discussed the plan of care.  He has no problems obtaining his medication from the pharmacy.  Continue current dose of Biktarvy.  Plan for follow-up in 6 months or sooner if needed with lab work 1 to 2 weeks prior to appointment.      Relevant Medications   bictegravir-emtricitabine-tenofovir AF (BIKTARVY) 50-200-25 MG TABS tablet   Other Relevant Orders   T-helper cell (CD4)- ([facility] clinic only)   CBC   HIV-1 RNA quant-no reflex-bld   Comprehensive metabolic panel   Healthcare maintenance     Gardasil updated today.  Discussed possibly entering study for hepatitis B vaccination.  Discussed importance of safe sexual practice to reduce risk of STI.  Condoms declined.  Encourage completion of dental cleaning/screening for routine dental care.      Screening for STDs (sexually transmitted diseases)   Relevant Orders   RPR    Other Visit Diagnoses    Pharmacologic therapy       Relevant Orders   Lipid panel       I am having Ronnie Patterson maintain his bismuth subsalicylate, ibuprofen, and Biktarvy.   Meds ordered this encounter  Medications  . bictegravir-emtricitabine-tenofovir AF (BIKTARVY) 50-200-25 MG TABS tablet    Sig: Take 1 tablet by  mouth daily.    Dispense:  30 tablet    Refill:  5    Order Specific Question:   Supervising Provider    Answer:   Judyann Munson [[id number]]     Follow-up: Return in about 6 months (around 01/23/2020), or if symptoms worsen or fail to improve.   Marcos Eke, MSN, FNP-C Nurse Practitioner [facility] for Infectious Disease [facility] Main number: [phone]

## 2019-07-23 NOTE — Assessment & Plan Note (Signed)
Mr. Loflin continues to have well-controlled HIV disease with good adherence and tolerance to his ART regimen of Biktarvy.  No signs/symptoms of opportunistic infection or progressive HIV disease.  We reviewed his lab work and discussed the plan of care.  He has no problems obtaining his medication from the pharmacy.  Continue current dose of Biktarvy.  Plan for follow-up in 6 months or sooner if needed with lab work 1 to 2 weeks prior to appointment.

## 2019-07-23 NOTE — Assessment & Plan Note (Signed)
   Gardasil updated today.  Discussed possibly entering study for hepatitis B vaccination.  Discussed importance of safe sexual practice to reduce risk of STI.  Condoms declined.  Encourage completion of dental cleaning/screening for routine dental care.

## 2019-07-23 NOTE — Patient Instructions (Signed)
Nice to see you.  Please continue take your [place] daily as prescribed.  Refills have been sent to the pharmacy.  We will have the research nurses contact you regarding the hepatitis B vaccination study.  Please schedule a routine dental screening/cleaning at your convenience.  Plan for follow-up in 6 months or sooner if needed with lab work 1 to 2 weeks prior to your appointment.  Have a great day and stay safe!

## 2019-08-01 ENCOUNTER — Other Ambulatory Visit: Payer: Self-pay | Admitting: Family

## 2019-08-01 DIAGNOSIS — B20 Human immunodeficiency virus [HIV] disease: Secondary | ICD-10-CM

## 2019-08-14 ENCOUNTER — Other Ambulatory Visit: Payer: Self-pay | Admitting: Family

## 2019-08-14 DIAGNOSIS — B20 Human immunodeficiency virus [HIV] disease: Secondary | ICD-10-CM

## 2019-08-21 ENCOUNTER — Other Ambulatory Visit: Payer: Self-pay | Admitting: Family

## 2019-08-21 DIAGNOSIS — B20 Human immunodeficiency virus [HIV] disease: Secondary | ICD-10-CM

## 2020-01-01 ENCOUNTER — Other Ambulatory Visit: Payer: Managed Care, Other (non HMO)

## 2020-01-01 ENCOUNTER — Other Ambulatory Visit: Payer: Self-pay

## 2020-01-01 DIAGNOSIS — B20 Human immunodeficiency virus [HIV] disease: Secondary | ICD-10-CM

## 2020-01-01 DIAGNOSIS — Z113 Encounter for screening for infections with a predominantly sexual mode of transmission: Secondary | ICD-10-CM

## 2020-01-01 DIAGNOSIS — Z79899 Other long term (current) drug therapy: Secondary | ICD-10-CM

## 2020-01-02 LAB — T-HELPER CELL (CD4) - (RCID CLINIC ONLY)
CD4 % Helper T Cell: 51 % (ref 33–65)
CD4 T Cell Abs: 1023 /uL (ref 400–1790)

## 2020-01-05 LAB — HIV-1 RNA QUANT-NO REFLEX-BLD
HIV 1 RNA Quant: <20 Copies/mL
HIV-1 RNA Quant, Log: <1.3 Log cps/mL

## 2020-01-05 LAB — CBC
HCT: 43.5 % (ref 38.5–50.0)
Hemoglobin: 14.5 g/dL (ref 13.2–17.1)
MCH: 30.4 pg (ref 27.0–33.0)
MCHC: 33.3 g/dL (ref 32.0–36.0)
MCV: 91.2 fL (ref 80.0–100.0)
MPV: 9.9 fL (ref 7.5–12.5)
Platelets: 288 10*3/uL (ref 140–400)
RBC: 4.77 10*6/uL (ref 4.20–5.80)
RDW: 12.5 % (ref 11.0–15.0)
WBC: 5.8 10*3/uL (ref 3.8–10.8)

## 2020-01-05 LAB — LIPID PANEL
Cholesterol: 156 mg/dL (ref <200)
HDL: 51 mg/dL (ref >=40)
LDL Cholesterol (Calc): 72 mg/dL (calc)
Non-HDL Cholesterol (Calc): 105 mg/dL (calc) (ref <130)
Total CHOL/HDL Ratio: 3.1 (calc) (ref <5.0)
Triglycerides: 254 mg/dL — ABNORMAL HIGH (ref <150)

## 2020-01-05 LAB — COMPREHENSIVE METABOLIC PANEL
AG Ratio: 1.8 (calc) (ref 1.0–2.5)
ALT: 26 U/L (ref 9–46)
AST: 26 U/L (ref 10–40)
Albumin: 4.4 g/dL (ref 3.6–5.1)
Alkaline phosphatase (APISO): 66 U/L (ref 36–130)
BUN: 15 mg/dL (ref 7–25)
CO2: 24 mmol/L (ref 20–32)
Calcium: 9.1 mg/dL (ref 8.6–10.3)
Chloride: 111 mmol/L — ABNORMAL HIGH (ref 98–110)
Creat: 1.17 mg/dL (ref 0.60–1.35)
Globulin: 2.5 g/dL (calc) (ref 1.9–3.7)
Glucose, Bld: 93 mg/dL (ref 65–99)
Potassium: 4.6 mmol/L (ref 3.5–5.3)
Sodium: 145 mmol/L (ref 135–146)
Total Bilirubin: 0.4 mg/dL (ref 0.2–1.2)
Total Protein: 6.9 g/dL (ref 6.1–8.1)

## 2020-01-05 LAB — FLUORESCENT TREPONEMAL AB(FTA)-IGG-BLD: Fluorescent Treponemal ABS: REACTIVE — AB

## 2020-01-05 LAB — RPR TITER: RPR Titer: 1:4 {titer} — ABNORMAL HIGH

## 2020-01-05 LAB — RPR: RPR Ser Ql: REACTIVE — AB

## 2020-01-16 ENCOUNTER — Encounter: Payer: Managed Care, Other (non HMO) | Admitting: Family

## 2020-01-17 ENCOUNTER — Encounter: Payer: Managed Care, Other (non HMO) | Admitting: Family

## 2020-02-05 ENCOUNTER — Ambulatory Visit (INDEPENDENT_AMBULATORY_CARE_PROVIDER_SITE_OTHER): Payer: Managed Care, Other (non HMO) | Admitting: Family

## 2020-02-05 ENCOUNTER — Encounter: Payer: Self-pay | Admitting: Family

## 2020-02-05 ENCOUNTER — Other Ambulatory Visit: Payer: Self-pay

## 2020-02-05 ENCOUNTER — Ambulatory Visit (INDEPENDENT_AMBULATORY_CARE_PROVIDER_SITE_OTHER): Payer: Managed Care, Other (non HMO)

## 2020-02-05 VITALS — BP 138/82 | HR 89 | Temp 97.6°F | Wt 163.0 lb

## 2020-02-05 DIAGNOSIS — Z0000 Encounter for general adult medical examination without abnormal findings: Secondary | ICD-10-CM | POA: Diagnosis not present

## 2020-02-05 DIAGNOSIS — Z23 Encounter for immunization: Secondary | ICD-10-CM

## 2020-02-05 DIAGNOSIS — B20 Human immunodeficiency virus [HIV] disease: Secondary | ICD-10-CM | POA: Diagnosis not present

## 2020-02-05 MED ORDER — BIKTARVY 50-200-25 MG PO TABS: 1.0000 | ORAL_TABLET | Freq: Every day | ORAL | 5 refills | Status: AC

## 2020-02-05 NOTE — Progress Notes (Signed)
   Covid-19 Vaccination Clinic  Name:  Lavarius Doughten    MRN: [id number] DOB: [birth year]  02/05/2020  Mr. Earwood was observed post Covid-19 immunization for 15 minutes without incident. He was provided with Vaccine Information Sheet and instruction to access the V-Safe system.   Mr. Mairena was instructed to call 911 with any severe reactions post vaccine: Marland Kitchen Difficulty breathing  . Swelling of face and throat  . A fast heartbeat  . A bad rash all over body  . Dizziness and weakness     Domonique Cothran T Pricilla Loveless

## 2020-02-05 NOTE — Assessment & Plan Note (Signed)
Mr. Besecker continues to have well-controlled HIV disease with good adherence and tolerance to his ART regimen of Biktarvy.  No signs/symptoms of opportunistic infection or progressive HIV disease.  We reviewed lab work and discussed the plan of care.  Continue current dose of Biktarvy.  Plan for follow-up in 4 months or sooner if needed with lab work 1 to 2 weeks prior to appointment.

## 2020-02-05 NOTE — Progress Notes (Signed)
Subjective:    Patient ID: Ronnie Patterson, male    DOB: 04-Sep-1995, 24 y.o.   MRN: [id number]  Chief Complaint  Patient presents with   Follow-up    No questions or concerns     HPI:  Ronnie Patterson is a 24 y.o. male with HIV disease who was last seen in the office on 07/23/2019 with good adherence and tolerance to his ART regimen of Biktarvy.  Viral load at the time was undetectable with CD4 count of 1103.  Most recent blood work completed on 01/01/2020 with viral load that remains undetectable and CD4 count of 1023.  RPR remains serofast at 1: 4 with previous treatment at 1: 32.  Here today for routine follow-up.  Ronnie Patterson continues to take his Biktarvy daily as prescribed with no adverse side effects or missed doses since his last office visit.  Overall feeling well today with no new concerns/complaints. Denies fevers, chills, night sweats, headaches, changes in vision, neck pain/stiffness, nausea, diarrhea, vomiting, lesions or rashes.  Ronnie Patterson has no problems obtaining his medication from the pharmacy and remains covered through [place].  Denies feelings of being down, depressed, or hopeless recently.  Continues to smoke marijuana 3-4 times per week and drinks alcohol and smokes tobacco on occasion.  Condoms provided per request.  Interested in receiving influenza and Covid booster today.  Recent eye exam but is due for routine dental care.  No Known Allergies   Outpatient Medications Prior to Visit  Medication Sig Dispense Refill   bismuth subsalicylate (PEPTO BISMOL) 262 MG/15ML suspension Take 30 mLs by mouth every 6 (six) hours as needed for indigestion.     ibuprofen (ADVIL,MOTRIN) 200 MG tablet Take 400 mg by mouth every 6 (six) hours as needed for moderate pain.     BIKTARVY 50-200-25 MG TABS tablet Take one tablet by mouth daily. 30 tablet 5   No facility-administered medications prior to visit.     Past Medical History:  Diagnosis Date   HIV infection (HCC)       History reviewed. No pertinent surgical history.   Review of Systems  Constitutional: Negative for appetite change, chills, fatigue, fever and unexpected weight change.  Eyes: Negative for visual disturbance.  Respiratory: Negative for cough, chest tightness, shortness of breath and wheezing.   Cardiovascular: Negative for chest pain and leg swelling.  Gastrointestinal: Negative for abdominal pain, constipation, diarrhea, nausea and vomiting.  Genitourinary: Negative for dysuria, flank pain, frequency, genital sores, hematuria and urgency.  Skin: Negative for rash.  Allergic/Immunologic: Negative for immunocompromised state.  Neurological: Negative for dizziness and headaches.      Objective:    BP 138/82    Pulse 89    Temp 97.6 F (36.4 C)    Wt 163 lb (73.9 kg)    SpO2 97%    BMI 24.07 kg/m  Nursing note and vital signs reviewed.  Physical Exam Constitutional:      General: He is not in acute distress.    Appearance: He is well-developed.  Eyes:     Conjunctiva/sclera: Conjunctivae normal.  Cardiovascular:     Rate and Rhythm: Normal rate and regular rhythm.     Heart sounds: Normal heart sounds. No murmur heard.  No friction rub. No gallop.   Pulmonary:     Effort: Pulmonary effort is normal. No respiratory distress.     Breath sounds: Normal breath sounds. No wheezing or rales.  Chest:     Chest wall: No tenderness.  Abdominal:  General: Bowel sounds are normal.     Palpations: Abdomen is soft.     Tenderness: There is no abdominal tenderness.  Musculoskeletal:     Cervical back: Neck supple.  Lymphadenopathy:     Cervical: No cervical adenopathy.  Skin:    General: Skin is warm and dry.     Findings: No rash.  Neurological:     Mental Status: He is alert and oriented to person, place, and time.  Psychiatric:        Behavior: Behavior normal.        Thought Content: Thought content normal.        Judgment: Judgment normal.      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  No signs/symptoms of opportunistic infection or progressive HIV disease.  We reviewed lab work and discussed the plan of care.  Continue current dose of Biktarvy.  Plan for follow-up in 4 months or sooner if needed with lab work 1 to 2 weeks prior to appointment.      Relevant Medications   bictegravir-emtricitabine-tenofovir AF (BIKTARVY) 50-200-25 MG TABS tablet   Other Relevant Orders   HIV-1 RNA quant-no reflex-bld   T-helper cell (CD4)- ([facility] clinic only)   Comprehensive metabolic panel   Healthcare maintenance     Influenza and third Pfizer booster provided today.  Discussed importance of safe sexual practice to reduce risk of STI.  Condoms provided.  Encouraged to complete routine dental care as he has Nurse, learning disability.        Other Visit Diagnoses    Need for immunization against influenza       Relevant Orders   Flu Vaccine QUAD 36+ mos IM (Completed)       I have changed [employer]. I am also having him maintain his bismuth subsalicylate and ibuprofen.   Meds ordered this encounter  Medications   bictegravir-emtricitabine-tenofovir AF (BIKTARVY) 50-200-25 MG TABS tablet    Sig: Take 1 tablet by mouth daily.    Dispense:  30 tablet    Refill:  5    Order Specific  Question:   Supervising Provider    Answer:   Judyann Munson [[id number]]     Follow-up: Return in about 4 months (around 06/07/2020), or if symptoms worsen or fail to improve.   Marcos Eke, MSN, FNP-C Nurse Practitioner [facility] for Infectious Disease [facility] Main number: [phone]

## 2020-02-05 NOTE — Assessment & Plan Note (Signed)
   Influenza and third Pfizer booster provided today.  Discussed importance of safe sexual practice to reduce risk of STI.  Condoms provided.  Encouraged to complete routine dental care as he has Nurse, learning disability.

## 2020-02-05 NOTE — Patient Instructions (Addendum)
Nice to see you.  We will continue your current dose of Biktarvy.  Refills have been sent to the pharmacy.   We will get your COVID booster today.  Plan for follow up in 4 months or sooner if needed with lab work 1-2 weeks prior to your appointment.   Have a great day and stay safe!

## 2020-06-03 DEATH — deceased

## 2020-06-10 ENCOUNTER — Other Ambulatory Visit: Payer: Managed Care, Other (non HMO)

## 2020-06-23 ENCOUNTER — Encounter: Payer: Managed Care, Other (non HMO) | Admitting: Family

## 2020-10-04 IMAGING — CR SACRUM AND COCCYX - 2+ VIEW
3 series · 3 of 3 positions shown · non-contrast
Comparison: None.

CLINICAL DATA: Coccygeal pain after a fall down stairs last night.
Initial encounter.

EXAM:
SACRUM AND COCCYX - 2+ VIEW

[t sacrum ap]
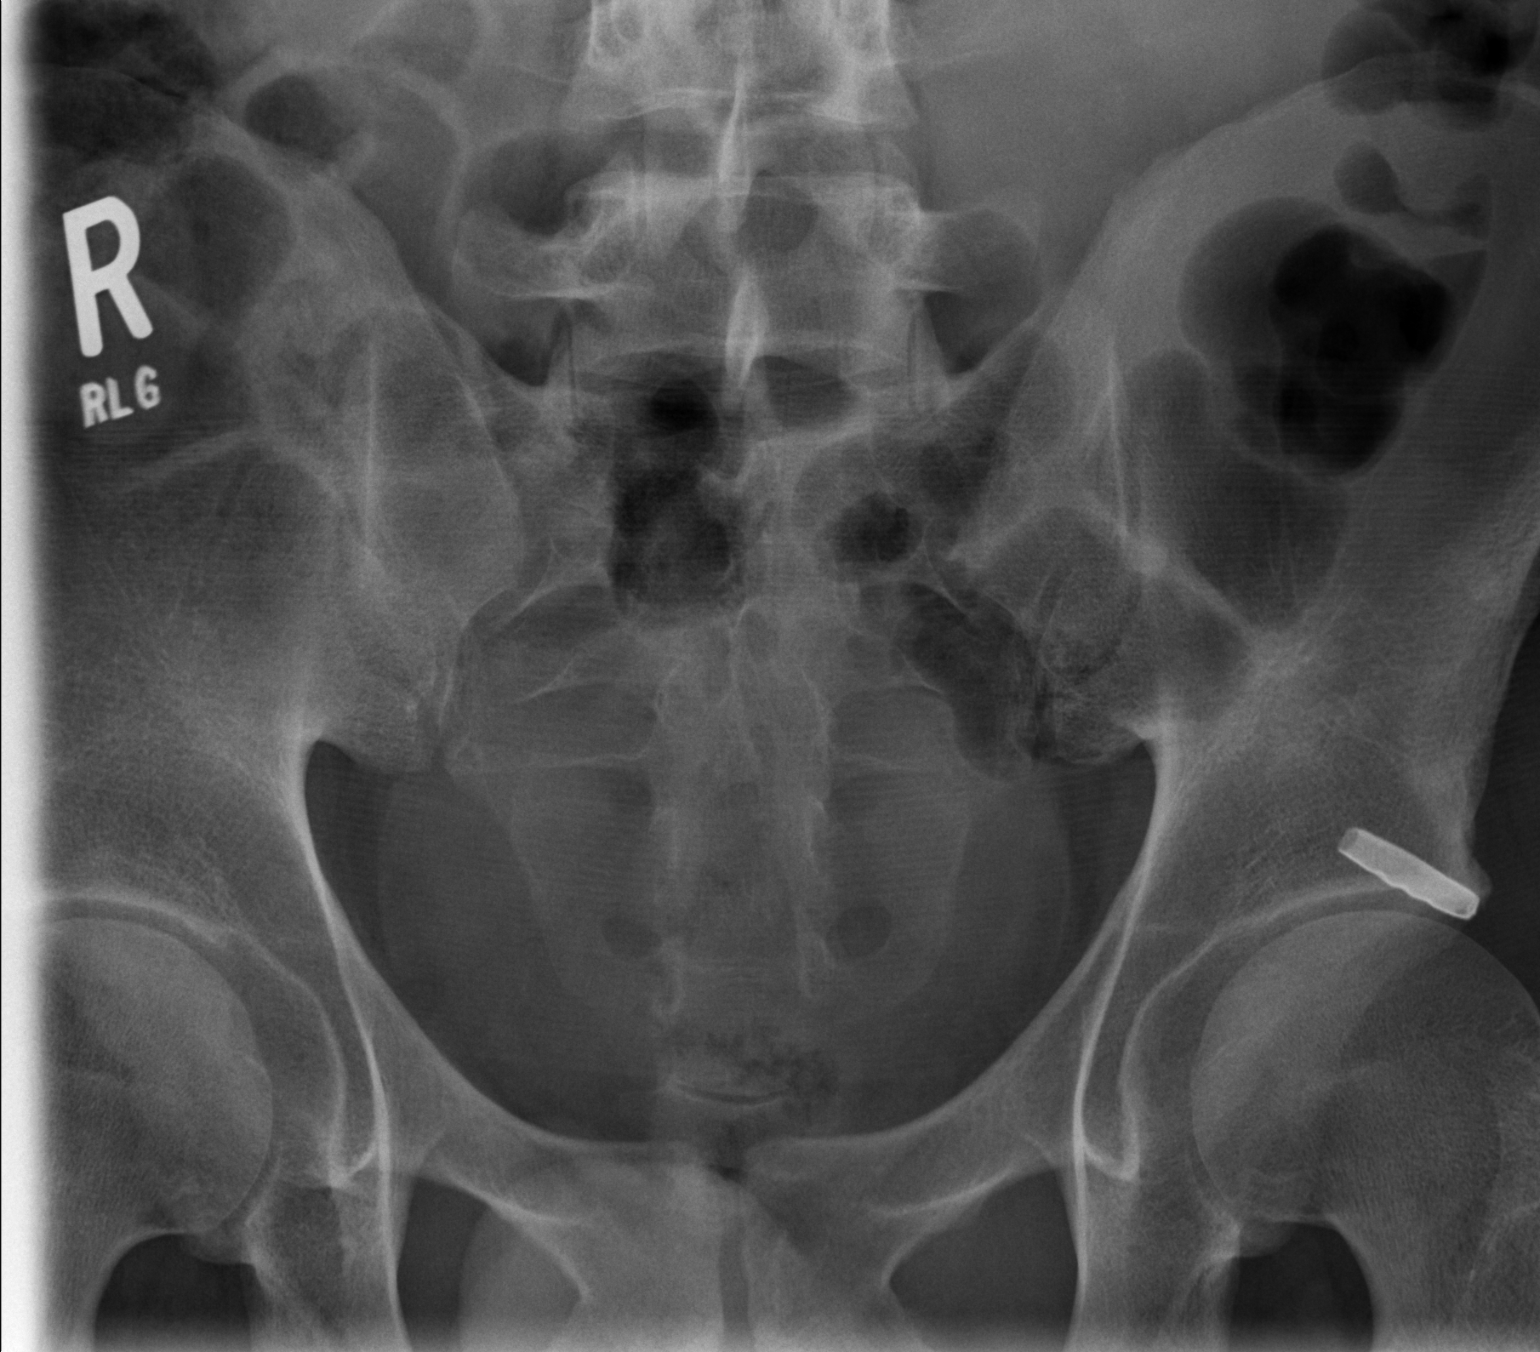

[t coccyx ap]
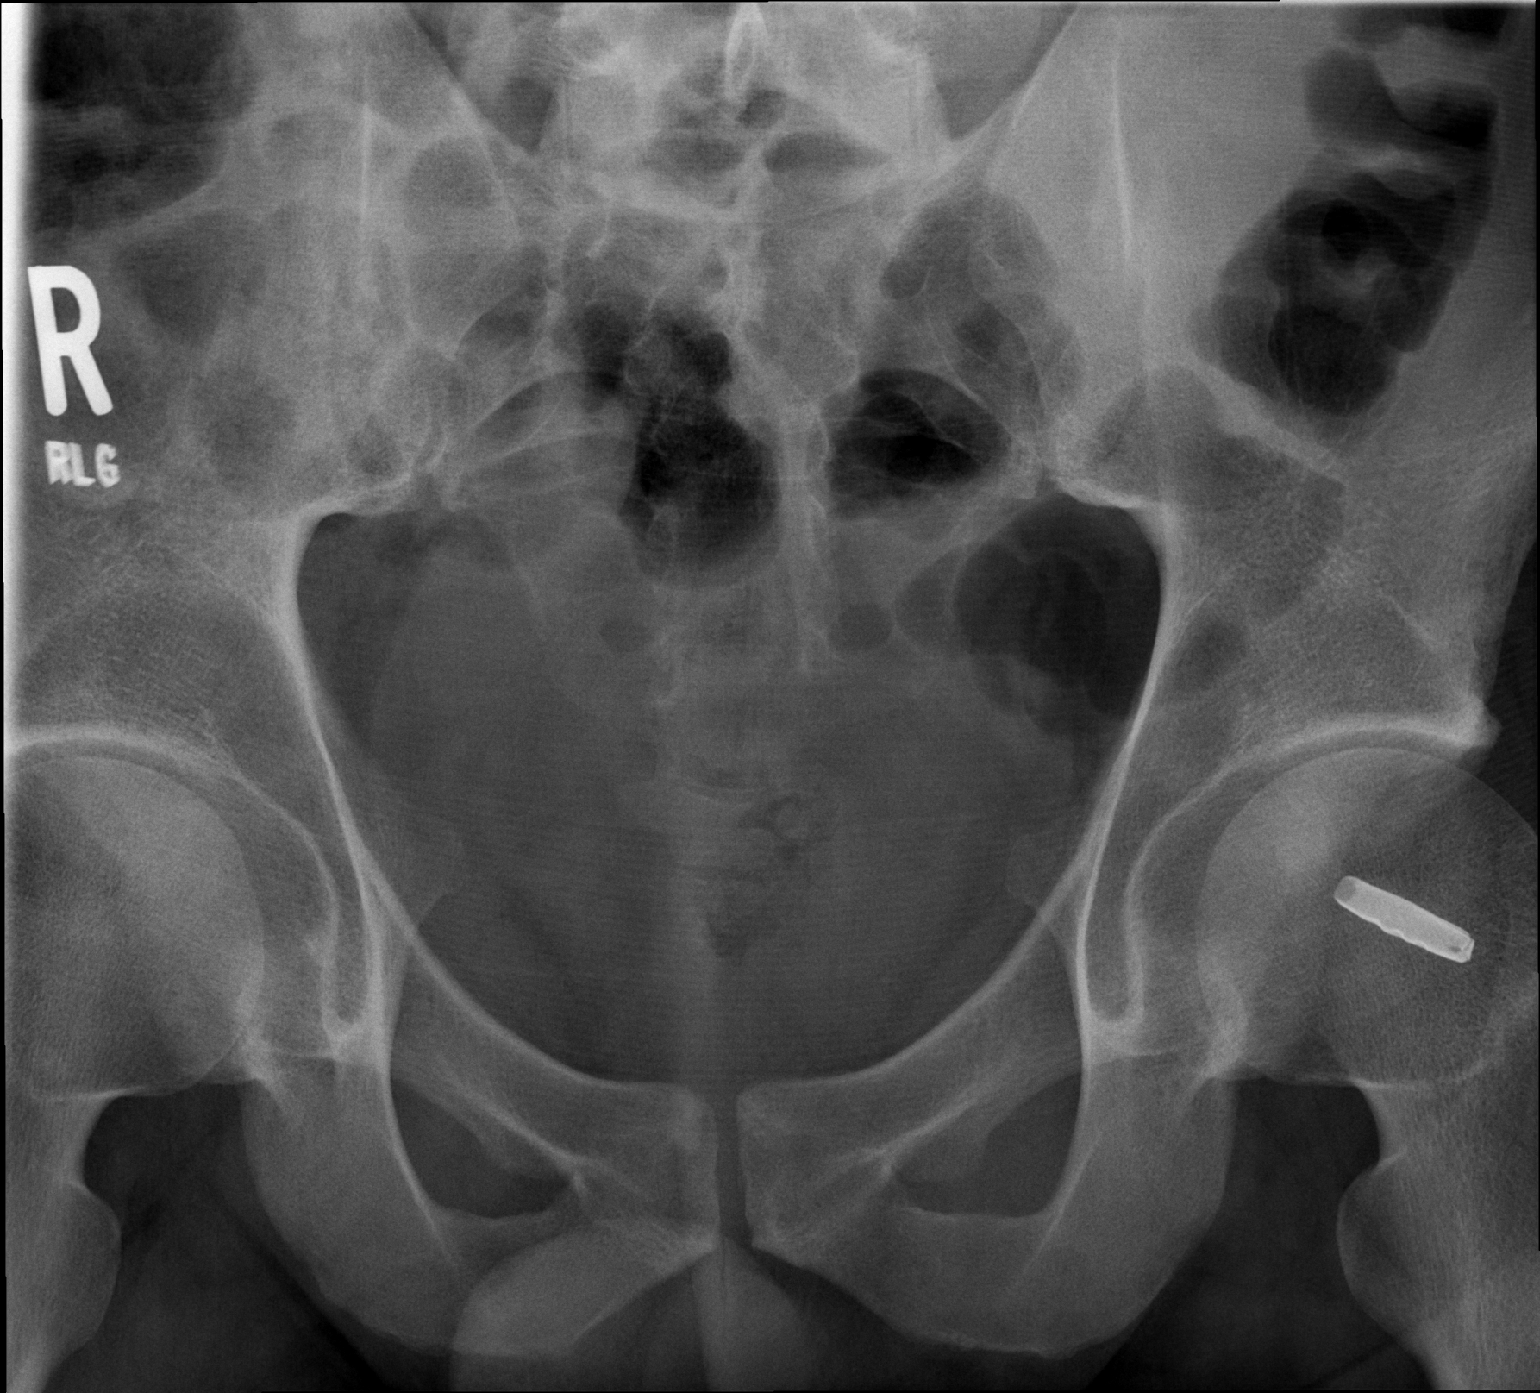

[t sacrum coccyx lat]
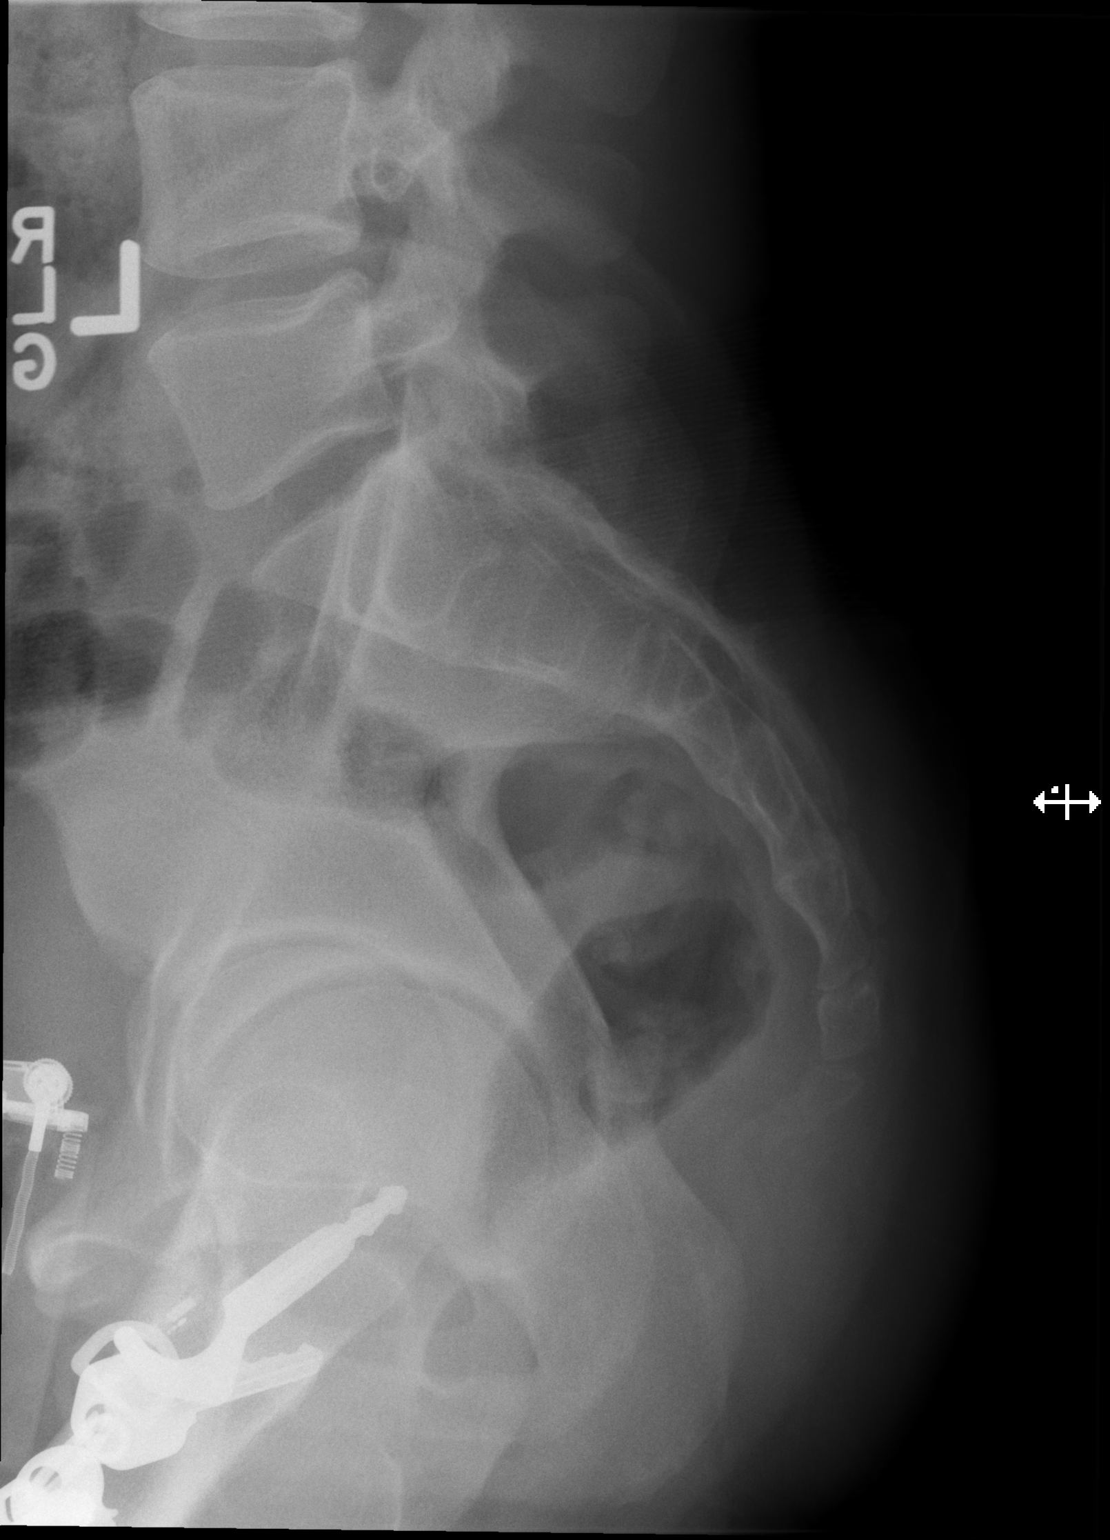

[3 of 3 positions shown; findings below may reference images not displayed]

FINDINGS: There is no evidence of fracture or other focal bone lesions.
IMPRESSION: Negative exam.

## 2020-12-16 ENCOUNTER — Telehealth: Payer: Self-pay | Admitting: *Deleted

## 2020-12-16 NOTE — Telephone Encounter (Signed)
Patient is deceased. RN providing documentation to [facility] for update in [employer]. Andree Coss, RN
